# Patient Record
Sex: Female | Born: 1995 | Marital: Single | Smoking: Never smoker
Health system: Southern US, Community
[De-identification: ages and names within clinical notes are randomized; demographics above are authoritative.]

## PROBLEM LIST (undated history)

## (undated) DIAGNOSIS — E282 Polycystic ovarian syndrome: Secondary | ICD-10-CM

## (undated) HISTORY — PX: NO PAST SURGERIES: SHX2092

---

## 2015-11-08 ENCOUNTER — Ambulatory Visit: Payer: Self-pay

## 2016-10-01 ENCOUNTER — Ambulatory Visit: Payer: Self-pay | Admitting: Family Medicine

## 2016-10-07 NOTE — Progress Notes (Addendum)
[facility] [address] [place], [place] [address] [phone]  Date:  10/09/2016   Name:  Lori Adams   DOB:  February 24, 1995   MRN:  [id number]  PCP:  Pearline Cables, MD    Chief Complaint: Establish Care   History of Present Illness:  Lori Adams is a 21 y.o. 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   She is working 30 hours a week and also taking 18 hours of classes - she is very very busy In her free time she likes to catch up on her sleep  She has not really had any major health problems in the past She has noted a rash under her breasts for about a year- seemed to start after she spent a day at the beach and was in wet clothes all day She tried ketoconazole- this helps to reduce the rash but then it will come back. Had never cleared up so far  Her menses tend to be quite irregular; she may go 4-5 months without menses. She has noticed this for about a year, since she gained more weight She gained weight while in college- about 40 lbs over the last 4-5 years.  She put on the last 10- 20 in the last year and this seems to be when her menses started to become so irregular  She had menarche in 6th grade, and her menses were fairly regular until the last year or so She does tend to have heavier hair growth on her body, but does not think that this is new, it is just how she is made  She has also noted more headaches when the weather is hot, and when she tried to do a keto diet.  She has made some efforts at weight loss but admits she is often so busy that she does not stick with  he plan  She has NEVER been SA so she is not concerned about pregnancy   There are no active problems to display for this patient.   History reviewed. No pertinent past medical history.  Past Surgical History:  Procedure Laterality Date  . NO PAST SURGERIES      Social History  Substance Use Topics  . Smoking status: Never Smoker  . Smokeless tobacco: Never Used  . Alcohol use Not on file    History reviewed. No pertinent family history.  No Known Allergies  Medication list has been reviewed and updated.  No current outpatient prescriptions on file prior to visit.   No current facility-administered medications on file prior to visit.     Review of Systems:  As per HPI- otherwise negative. No fever or chills   No fever or chills No CP or SOB No rash except under breasts No nausea, vomiting or diarrhea  Physical Examination: Vitals:   10/09/16 1722  BP: 108/74  Pulse: 87  Resp: 14  Temp: 97.7 F (36.5 C)  SpO2: 97%   Vitals:   10/09/16 1722  Weight: 178 lb 2 oz (80.8  kg)  Height: 5\' 1"  (1.549 m)   Body mass index is 33.66 kg/m. Ideal Body Weight: Weight in (lb) to have BMI = 25: 132  GEN: WDWN, NAD, Non-toxic, A & O x 3,obese, otherwise looks well HEENT: Atraumatic, Normocephalic. Neck supple. No masses, No LAD.  Bilateral TM wnl, oropharynx normal.  PEERL,EOMI.   Ears and Nose: No external deformity. CV: RRR, No M/G/R. No JVD. No thrill. No extra heart sounds. PULM: CTA B, no wheezes, crackles, rhonchi. No retractions. No resp. distress. No accessory muscle use. ABD: S, NT, ND, +BS. No rebound. No HSM. EXTR: No c/c/e NEURO Normal gait.  PSYCH: Normally interactive. Conversant. Not depressed or anxious appearing.  Calm demeanor.  She has candida intertrigo under her breasts bilaterally   Assessment and Plan: Irregular menses - Plan: metFORMIN (GLUCOPHAGE) 500 MG tablet  Candidal intertrigo - Plan: fluconazole (DIFLUCAN) 150 MG tablet  PCOS  (polycystic ovarian syndrome) - Plan: metFORMIN (GLUCOPHAGE) 500 MG tablet, Hemoglobin A1c, Testosterone  Screening for hyperlipidemia - Plan: Lipid panel  Weight gain - Plan: TSH, Hemoglobin A1c  Screening for diabetes mellitus - Plan: Comprehensive metabolic panel, Hemoglobin A1c  Screening for deficiency anemia - Plan: CBC  Here today as a new patient She will come in for fasting labs soon Treat persistent candida intertrigo with diflucan weekly for 4 weeks She would like to try metformin in hopes of returning her menstrual regularity with presumed PCOS.  Encouraged her to work on gradual weight loss by reducing caloric intake and increasing exercise and seh will try Will plan further follow- up pending labs.    Signed Abbe Amsterdam, MD  Received her labs 9/4  Her testosterone is on the high side of normal for women- PCOS is likely  Called her to discuss.  Went over her labs, will mail her a copy as well.  She is having a little dizziness and lack of energy with metformin, but would like to try and take it for 3-4 months and see how she does  Lab Results  Component Value Date   HGBA1C 5.5 10/16/2016   Lab Results  Component Value Date   TESTOSTERONE 62.14 (H) 10/16/2016

## 2016-10-09 ENCOUNTER — Ambulatory Visit (INDEPENDENT_AMBULATORY_CARE_PROVIDER_SITE_OTHER): Payer: BC Managed Care – PPO | Admitting: Family Medicine

## 2016-10-09 ENCOUNTER — Encounter: Payer: Self-pay | Admitting: Family Medicine

## 2016-10-09 VITALS — BP 108/74 | HR 87 | Temp 97.7°F | Resp 14 | Ht 61.0 in | Wt 178.1 lb

## 2016-10-09 DIAGNOSIS — Z13220 Encounter for screening for lipoid disorders: Secondary | ICD-10-CM | POA: Diagnosis not present

## 2016-10-09 DIAGNOSIS — B372 Candidiasis of skin and nail: Secondary | ICD-10-CM

## 2016-10-09 DIAGNOSIS — Z130 Encounter for screening for diseases of the blood and blood-forming organs and certain disorders involving the immune mechanism: Secondary | ICD-10-CM

## 2016-10-09 DIAGNOSIS — N926 Irregular menstruation, unspecified: Secondary | ICD-10-CM

## 2016-10-09 DIAGNOSIS — E282 Polycystic ovarian syndrome: Secondary | ICD-10-CM

## 2016-10-09 DIAGNOSIS — Z131 Encounter for screening for diabetes mellitus: Secondary | ICD-10-CM | POA: Diagnosis not present

## 2016-10-09 DIAGNOSIS — R635 Abnormal weight gain: Secondary | ICD-10-CM

## 2016-10-09 MED ORDER — METFORMIN HCL 500 MG PO TABS: 500.0000 mg | ORAL_TABLET | Freq: Two times a day (BID) | ORAL | 3 refills | Status: DC

## 2016-10-09 MED ORDER — FLUCONAZOLE 150 MG PO TABS: 150.0000 mg | ORAL_TABLET | Freq: Once | ORAL | 0 refills | Status: AC

## 2016-10-09 NOTE — Patient Instructions (Signed)
It was nice to meet you today!  Please come in for labs at your convenience in the next week or two- fasting in the morning. I will be in touch with your results asap   We will have you use the diflucan once a week for 4 weeks to clear up your candida rash Start on the metformin once a day, and increase to twice a day if tolerated for likely PCOS.   We will check your testosterone level on your upcoming labs to help confirm this diagnosis

## 2016-10-16 ENCOUNTER — Other Ambulatory Visit (INDEPENDENT_AMBULATORY_CARE_PROVIDER_SITE_OTHER): Payer: BC Managed Care – PPO

## 2016-10-16 DIAGNOSIS — Z13220 Encounter for screening for lipoid disorders: Secondary | ICD-10-CM | POA: Diagnosis not present

## 2016-10-16 DIAGNOSIS — Z130 Encounter for screening for diseases of the blood and blood-forming organs and certain disorders involving the immune mechanism: Secondary | ICD-10-CM | POA: Diagnosis not present

## 2016-10-16 DIAGNOSIS — R635 Abnormal weight gain: Secondary | ICD-10-CM | POA: Diagnosis not present

## 2016-10-16 DIAGNOSIS — E282 Polycystic ovarian syndrome: Secondary | ICD-10-CM | POA: Diagnosis not present

## 2016-10-16 DIAGNOSIS — Z131 Encounter for screening for diabetes mellitus: Secondary | ICD-10-CM

## 2016-10-16 LAB — COMPREHENSIVE METABOLIC PANEL
ALBUMIN: 4.7 g/dL (ref 3.5–5.2)
ALT: 40 U/L — ABNORMAL HIGH (ref 0–35)
AST: 26 U/L (ref 0–37)
Alkaline Phosphatase: 43 U/L (ref 39–117)
BUN: 9 mg/dL (ref 6–23)
CHLORIDE: 103 meq/L (ref 96–112)
CO2: 27 mEq/L (ref 19–32)
Calcium: 9.8 mg/dL (ref 8.4–10.5)
Creatinine, Ser: 0.62 mg/dL (ref 0.40–1.20)
GFR: 127.57 mL/min (ref >60.00)
Glucose, Bld: 100 mg/dL — ABNORMAL HIGH (ref 70–99)
POTASSIUM: 4.2 meq/L (ref 3.5–5.1)
SODIUM: 137 meq/L (ref 135–145)
Total Bilirubin: 0.6 mg/dL (ref 0.2–1.2)
Total Protein: 7.7 g/dL (ref 6.0–8.3)

## 2016-10-16 LAB — LIPID PANEL
CHOLESTEROL: 176 mg/dL (ref 0–200)
HDL: 40.3 mg/dL (ref >39.00)
LDL CALC: 110 mg/dL — AB (ref 0–99)
NonHDL: 135.61
TRIGLYCERIDES: 130 mg/dL (ref 0.0–149.0)
Total CHOL/HDL Ratio: 4
VLDL: 26 mg/dL (ref 0.0–40.0)

## 2016-10-16 LAB — CBC
HCT: 41.7 % (ref 36.0–46.0)
Hemoglobin: 14.1 g/dL (ref 12.0–15.0)
MCHC: 33.7 g/dL (ref 30.0–36.0)
MCV: 86.4 fl (ref 78.0–100.0)
Platelets: 277 10*3/uL (ref 150.0–400.0)
RBC: 4.83 Mil/uL (ref 3.87–5.11)
RDW: 12.4 % (ref 11.5–15.5)
WBC: 7.8 10*3/uL (ref 4.0–10.5)

## 2016-10-16 LAB — TESTOSTERONE: TESTOSTERONE: 62.14 ng/dL — AB (ref 15.00–40.00)

## 2016-10-16 LAB — HEMOGLOBIN A1C: HEMOGLOBIN A1C: 5.5 % (ref 4.6–6.5)

## 2016-10-16 LAB — TSH: TSH: 3.75 u[IU]/mL (ref 0.35–4.50)

## 2016-10-21 ENCOUNTER — Telehealth: Payer: Self-pay | Admitting: Family Medicine

## 2016-10-21 NOTE — Telephone Encounter (Signed)
°  Relation to ZO:XWRUpt:self Call back [phone]   Reason for call:  Patient inquiring about lab results, please advise

## 2016-11-13 NOTE — Telephone Encounter (Signed)
Patient received lab results and confirmed address. Patient stated she was prescribed metFORMIN (GLUCOPHAGE) 500 MG tablet and states medication made her [place]ase[place] and states she would like an alternate. Patient states due to exams and work she declined appointment at this time. Patient states she would like PCP to follow up with her at 11am tommorrow, informed patient PCP is out of the office until Monday. Advised patient to sign up with MyChart so she can communicate with PCP at her convenience, patient voice understanding

## 2016-11-13 NOTE — Telephone Encounter (Signed)
Called her and [facility]- I sent her a letter with her labs on 9/4- perhaps we do not have the correct address?  Will send out another copy, went over results briefly in message as well

## 2016-11-14 ENCOUNTER — Encounter: Payer: Self-pay | Admitting: Family Medicine

## 2016-11-14 NOTE — Telephone Encounter (Signed)
My chart is active, will communicate with pt there

## 2016-11-17 NOTE — Telephone Encounter (Signed)
Noted  

## 2016-11-20 ENCOUNTER — Ambulatory Visit (INDEPENDENT_AMBULATORY_CARE_PROVIDER_SITE_OTHER): Payer: BC Managed Care – PPO | Admitting: Family Medicine

## 2016-11-20 VITALS — BP 118/76 | HR 89 | Temp 98.3°F | Ht 61.0 in | Wt 171.2 lb

## 2016-11-20 DIAGNOSIS — B372 Candidiasis of skin and nail: Secondary | ICD-10-CM | POA: Diagnosis not present

## 2016-11-20 DIAGNOSIS — Z23 Encounter for immunization: Secondary | ICD-10-CM | POA: Diagnosis not present

## 2016-11-20 DIAGNOSIS — N926 Irregular menstruation, unspecified: Secondary | ICD-10-CM

## 2016-11-20 MED ORDER — NYSTATIN-TRIAMCINOLONE 100000-0.1 UNIT/GM-% EX OINT: 1.0000 "application " | TOPICAL_OINTMENT | Freq: Two times a day (BID) | CUTANEOUS | 0 refills | Status: DC

## 2016-11-20 NOTE — Patient Instructions (Addendum)
It was great to see you again today- try the combination cream that I gave you today,but remember it may take some time for the darkening of your skin to resolve.  That is great that you got a period!  Continue to work on gradual weight loss, but do let me know if your appetite does not come back to normal soon or if you have any belly pain or other concerning symptoms.   Please let me know how your rash responds to the medicatoin

## 2016-11-20 NOTE — Progress Notes (Signed)
[facility] [address] [place], [place] [address] [phone]  Date:  11/20/2016   Name:  Lori Adams   DOB:  [birth year]   MRN:  [id number]  PCP:  Pearline Cables, MD    Chief Complaint: Follow-up (Pt here to f/u on rash under breast. Pt states that the infection is still present. Declined flu vaccine today. )   History of Present Illness:  Lori Adams is a 21 y.o. 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   She is working 30 hours a week and also taking 18 hours of classes - she is very very busy In her free time she likes to catch up on her sleep  She has not really had any major health problems in the past She has noted a rash under her breasts for about a year- seemed to start after she spent a day at the beach and was in wet clothes all day She tried ketoconazole- this helps to reduce the rash but then it will come back. Had never cleared up so far  Her menses tend to be quite irregular; she may go 4-5 months without menses. She has noticed this for about a year, since she gained more weight She gained weight while in college- about 40 lbs over the last 4-5 years.  She put on the last 10- 20 in the last year and this seems to be when her menses started to become so irregular  She had menarche in 6th grade, and her menses were fairly regular until the last year or so She does tend to have heavier hair growth on her body, but does not think that this is new, it is just how she is made  She has also noted more headaches when the weather is hot, and when she tried to do a keto diet.  She has made some efforts at weight loss but admits she is often so busy that she does not stick with he  plan  She has NEVER been SA so she is not concerned about pregnancy   Her clinical picture is c/w PCOS, and she wished to try metformin to try and induce regular periods.  However she felt like it was causing depression and suicidal thoughts, so she stopped using it 3 weeks ago She also states that for the last week she has felt nauseated every time she eats so she is is not eating a whole lot. She denies any belly pain.  There is no way she could be pregnant.    She actaully did have a menstrual bleed which is great news!  Bled for about 3 days a week ago Wt Readings from Last 3 Encounters:  11/20/16 171 lb 3.2 oz (77.7 kg)  10/09/16 178 lb 2 oz (80.8 kg)   Flu shot is due   We tried some diflucan for her rash but it did not help  Patient Active Problem List   Diagnosis Date Noted  . Irregular menses 10/09/2016  . PCOS (polycystic ovarian syndrome) 10/09/2016  . Weight gain 10/09/2016    No past medical history on file.  Past Surgical History:  Procedure Laterality  Date  . NO PAST SURGERIES      Social History  Substance Use Topics  . Smoking status: Never Smoker  . Smokeless tobacco: Never Used  . Alcohol use Not on file    No family history on file.  No Known Allergies  Medication list has been reviewed and updated.  Current Outpatient Prescriptions on File Prior to Visit  Medication Sig Dispense Refill  . metFORMIN (GLUCOPHAGE) 500 MG tablet Take 1 tablet (500 mg total) by mouth 2 (two) times daily with a meal. (Patient not taking: Reported on 11/20/2016) 180 tablet 3   No current facility-administered medications on file prior to visit.     Review of Systems:  As per HPI- otherwise negative.   Physical Examination: Vitals:   11/20/16 1117  BP: 118/76  Pulse: 89  Temp: 98.3 F (36.8 C)  SpO2: 98%   Vitals:   11/20/16 1117  Weight: 171 lb 3.2 oz (77.7 kg)  Height:  (1.549 m)   Body mass index is 32.35 kg/m. Ideal Body Weight: Weight in  (lb) to have BMI = 25: 132  GEN: WDWN, NAD, Non-toxic, A & O x 3, obese, but has lost a few lbs  HEENT: Atraumatic, Normocephalic. Neck supple. No masses, No LAD. Ears and Nose: No external deformity. CV: RRR, No M/G/R. No JVD. No thrill. No extra heart sounds. PULM: CTA B, no wheezes, crackles, rhonchi. No retractions. No resp. distress. No accessory muscle use. ABD: S, NT, ND, +BS. No rebound. No HSM.  Benign belly EXTR: No c/c/e NEURO Normal gait.  PSYCH: Normally interactive. Conversant. Not depressed or anxious appearing.  Calm demeanor.  Hyperpigmented confluent macular rash under both breasts likely due to candida intertrigo   Assessment and Plan: Irregular menses  Candidal intertrigo - Plan: nystatin-triamcinolone ointment (MYCOLOG) PCOS- she stopped metformin as she was not able to tolerate it.  However she has lost 10 lbs and also did have a spontaneous bleed about 10 days ago.  Encouraged her to continue to work on gradual weight loss Po dilfucan did not resolve her rash- will try mycolog for her.  However did ask her to let me know how this works for her over the next 2-3 weeks.  Also counseled that hyperpigmentation may take several months to resolve    Signed Abbe Amsterdam, MD

## 2017-01-06 ENCOUNTER — Emergency Department (HOSPITAL_COMMUNITY)
Admission: EM | Admit: 2017-01-06 | Discharge: 2017-01-06 | Discharge status: Against Medical Advice | Payer: BC Managed Care – PPO | Attending: Emergency Medicine | Admitting: Emergency Medicine

## 2017-01-06 ENCOUNTER — Emergency Department (HOSPITAL_COMMUNITY): Payer: BC Managed Care – PPO

## 2017-01-06 ENCOUNTER — Encounter (HOSPITAL_COMMUNITY): Payer: Self-pay | Admitting: Emergency Medicine

## 2017-01-06 DIAGNOSIS — Z5321 Procedure and treatment not carried out due to patient leaving prior to being seen by health care provider: Secondary | ICD-10-CM | POA: Diagnosis not present

## 2017-01-06 DIAGNOSIS — R0789 Other chest pain: Secondary | ICD-10-CM | POA: Diagnosis present

## 2017-01-06 LAB — CBC
HEMATOCRIT: 40.2 % (ref 36.0–46.0)
HEMOGLOBIN: 13.8 g/dL (ref 12.0–15.0)
MCH: 29.3 pg (ref 26.0–34.0)
MCHC: 34.3 g/dL (ref 30.0–36.0)
MCV: 85.4 fL (ref 78.0–100.0)
Platelets: 303 10*3/uL (ref 150–400)
RBC: 4.71 MIL/uL (ref 3.87–5.11)
RDW: 12.1 % (ref 11.5–15.5)
WBC: 8 10*3/uL (ref 4.0–10.5)

## 2017-01-06 LAB — BASIC METABOLIC PANEL
ANION GAP: 9 (ref 5–15)
BUN: 8 mg/dL (ref 6–20)
CO2: 27 mmol/L (ref 22–32)
Calcium: 9.4 mg/dL (ref 8.9–10.3)
Chloride: 103 mmol/L (ref 101–111)
Creatinine, Ser: 0.52 mg/dL (ref 0.44–1.00)
Glucose, Bld: 105 mg/dL — ABNORMAL HIGH (ref 65–99)
POTASSIUM: 3.5 mmol/L (ref 3.5–5.1)
SODIUM: 139 mmol/L (ref 135–145)

## 2017-01-06 LAB — I-STAT TROPONIN, ED: Troponin i, poc: 0 ng/mL (ref 0.00–0.08)

## 2017-01-06 NOTE — ED Notes (Signed)
no answer from lobby

## 2017-01-06 NOTE — ED Triage Notes (Signed)
Patient c/o intermittent heaviness in central chest with SOB and nausea since waking up this morning. Denies V/D. Ambulatory.

## 2017-01-06 NOTE — ED Notes (Signed)
No answer from lobby  

## 2017-03-26 ENCOUNTER — Encounter: Payer: Self-pay | Admitting: Family Medicine

## 2017-03-26 ENCOUNTER — Ambulatory Visit: Payer: BC Managed Care – PPO | Admitting: Family Medicine

## 2017-03-26 VITALS — BP 122/82 | HR 78 | Temp 97.7°F | Ht 61.0 in | Wt 165.2 lb

## 2017-03-26 DIAGNOSIS — R233 Spontaneous ecchymoses: Secondary | ICD-10-CM

## 2017-03-26 DIAGNOSIS — E282 Polycystic ovarian syndrome: Secondary | ICD-10-CM | POA: Diagnosis not present

## 2017-03-26 DIAGNOSIS — R740 Nonspecific elevation of levels of transaminase and lactic acid dehydrogenase [LDH]: Secondary | ICD-10-CM | POA: Diagnosis not present

## 2017-03-26 DIAGNOSIS — R238 Other skin changes: Secondary | ICD-10-CM

## 2017-03-26 DIAGNOSIS — Z113 Encounter for screening for infections with a predominantly sexual mode of transmission: Secondary | ICD-10-CM

## 2017-03-26 DIAGNOSIS — R7401 Elevation of levels of liver transaminase levels: Secondary | ICD-10-CM

## 2017-03-26 DIAGNOSIS — Z789 Other specified health status: Secondary | ICD-10-CM

## 2017-03-26 DIAGNOSIS — N926 Irregular menstruation, unspecified: Secondary | ICD-10-CM

## 2017-03-26 LAB — COMPREHENSIVE METABOLIC PANEL
ALBUMIN: 4.8 g/dL (ref 3.5–5.2)
ALT: 49 U/L — ABNORMAL HIGH (ref 0–35)
AST: 28 U/L (ref 0–37)
Alkaline Phosphatase: 46 U/L (ref 39–117)
BUN: 9 mg/dL (ref 6–23)
CALCIUM: 9.9 mg/dL (ref 8.4–10.5)
CHLORIDE: 102 meq/L (ref 96–112)
CO2: 26 mEq/L (ref 19–32)
Creatinine, Ser: 0.54 mg/dL (ref 0.40–1.20)
GFR: 149.03 mL/min (ref >60.00)
Glucose, Bld: 93 mg/dL (ref 70–99)
POTASSIUM: 3.7 meq/L (ref 3.5–5.1)
SODIUM: 138 meq/L (ref 135–145)
Total Bilirubin: 0.5 mg/dL (ref 0.2–1.2)
Total Protein: 8.1 g/dL (ref 6.0–8.3)

## 2017-03-26 LAB — CBC
HCT: 44.5 % (ref 36.0–46.0)
Hemoglobin: 15.4 g/dL — ABNORMAL HIGH (ref 12.0–15.0)
MCHC: 34.5 g/dL (ref 30.0–36.0)
MCV: 85.3 fl (ref 78.0–100.0)
PLATELETS: 346 10*3/uL (ref 150.0–400.0)
RBC: 5.22 Mil/uL — ABNORMAL HIGH (ref 3.87–5.11)
RDW: 12.4 % (ref 11.5–15.5)
WBC: 7.6 10*3/uL (ref 4.0–10.5)

## 2017-03-26 LAB — VITAMIN B12: Vitamin B-12: 373 pg/mL (ref 211–911)

## 2017-03-26 LAB — POCT URINE PREGNANCY: PREG TEST UR: NEGATIVE

## 2017-03-26 MED ORDER — LEVONORGESTREL-ETHINYL ESTRAD 0.1-20 MG-MCG PO TABS
1.0000 | ORAL_TABLET | Freq: Every day | ORAL | 4 refills | Status: DC
Start: 2017-03-26 — End: 2017-10-01

## 2017-03-26 NOTE — Patient Instructions (Addendum)
It was nice to see you today! I will be in touch with your labs asap   You can start on birth control pill after you next period.  It can be easiest to start the pill on the Sunday after your bleeding begins. However, if you do not get a period in the next 1-2 weeks you can start on the pill anyway. Take it once a day, about the same time each day.   Let me know if any problems or concerns

## 2017-03-26 NOTE — Progress Notes (Addendum)
[facility] [address], [address] [place], [place] [address] [phone]  Date:  03/26/2017   Name:  Lori Adams   DOB:  1995/03/15   MRN:  [id number]  PCP:  Pearline Cables, MD    Chief Complaint: Weight Loss (c/o weight loss, and fatigue. )   History of Present Illness:  Lori Adams is a 22 y.o. very pleasant female patient who presents with the following:  I last saw this pt in October of 18 PCOS- she stopped metformin as she was not able to tolerate it.  However she has lost 10 lbs and also did have a spontaneous bleed about 10 days ago.  Encouraged her to continue to work on gradual weight loss Po dilfucan did not resolve her rash- will try mycolog for her.  However did ask her to let me know how this works for her over the next 2-3 weeks.  Also counseled that hyperpigmentation may take several months to resolve   She has started getting menses again- this is good news  Wt Readings from Last 3 Encounters:  03/26/17 165 lb 3.2 oz (74.9 kg)  11/20/16 171 lb 3.2 oz (77.7 kg)  10/09/16 178 lb 2 oz (80.8 kg)   She has lost some weight and this seems to have helped trigger regular menses  She has been engaging in some sexual activity although not intercourse  She has noted "muscle aches and fatigue all the time" and that she will get tiny bruises on her body easily  She did become a vegetarian over the last few months- will check a B12 for her   She would like to start on OCP at this time for menstrual regulation- No history of DVT or PT No migraine Non -smoker No history of cancer or HTN  BP Readings from Last 3 Encounters:  03/26/17 122/82  01/06/17 (!) 156/84  11/20/16 118/76     Patient Active Problem List   Diagnosis Date Noted  . Irregular menses 10/09/2016  . PCOS (polycystic ovarian syndrome) 10/09/2016  . Weight gain 10/09/2016    No past medical history on file.  Past Surgical History:   Procedure Laterality Date  . NO PAST SURGERIES      Social History   Tobacco Use  . Smoking status: Never Smoker  . Smokeless tobacco: Never Used  Substance Use Topics  . Alcohol use: Not on file  . Drug use: Not on file    No family history on file.  No Known Allergies  Medication list has been reviewed and updated.  Current Outpatient Medications on File Prior to Visit  Medication Sig Dispense Refill  . nystatin-triamcinolone ointment (MYCOLOG) Apply 1 application topically 2 (two) times daily. 60 g 0   No current facility-administered medications on file prior to visit.     Review of Systems:  As per HPI- otherwise negative. No fever or chills No CP or SOB No nausea or vomiting   Physical Examination: Vitals:   03/26/17 1357  BP: 122/82  Pulse: 78  Temp: 97.7 F (36.5 C)  SpO2: 98%   Vitals:   03/26/17 1357  Weight: 165 lb 3.2 oz (74.9 kg)  Height: 5\' 1"  (1.549 m)   Body mass index is 31.21 kg/m. Ideal Body Weight: Weight in (lb) to have BMI = 25: 132  GEN: WDWN, NAD, Non-toxic, A & O x 3 HEENT: Atraumatic, Normocephalic. Neck supple. No masses, No  LAD.  Bilateral TM wnl, oropharynx normal.  PEERL,EOMI.   Ears and Nose: No external deformity. CV: RRR, No M/G/R. No JVD. No thrill. No extra heart sounds. PULM: CTA B, no wheezes, crackles, rhonchi. No retractions. No resp. distress. No accessory muscle use. ABD: S, NT, ND, +BS. No rebound. No HSM. EXTR: No c/c/e NEURO Normal gait.  PSYCH: Normally interactive. Conversant. Not depressed or anxious appearing.  Calm demeanor.  Overweight but has lost.  Looks well today . Results for orders placed or performed in visit on 03/26/17  POCT urine pregnancy  Result Value Ref Range   Preg Test, Ur Negative Negative    Assessment and Plan: PCOS (polycystic ovarian syndrome) - Plan: levonorgestrel-ethinyl estradiol (AVIANE,ALESSE,LESSINA) 0.1-20 MG-MCG tablet, POCT urine pregnancy  Routine screening for  STI (sexually transmitted infection) - Plan: Hepatitis B surface antibody, Hepatitis B surface antigen, Hepatitis C antibody, HIV antibody, RPR, C. trachomatis/N. gonorrhoeae RNA  Easy bruising - Plan: CBC, B12  Transaminitis - Plan: Comprehensive metabolic panel  Menstrual irregularity - Plan: levonorgestrel-ethinyl estradiol (AVIANE,ALESSE,LESSINA) 0.1-20 MG-MCG tablet, POCT urine pregnancy  Vegetarian - Plan: B12  Went over how to start OCP with her. She will let me know if any questions or concerns.  She is not SA, but would like to use this for menstrual regulation.  She is also seeing someone more seriously; would like STI screening although they have not actually had intercourse yet   Will plan further follow- up pending labs.   Signed Abbe AmsterdamJessica Copland, MD  Received her labs 2/8- message to pt   Negative for gonorrhea, chlamydia, hep B, hep C, HIV and syphilis.  You are immune to hep B (from immunization) so you do not need to be tested for this again  Your vitamin B12 level is normal Blood count is normal except your red cells are slightly high- like the opposite of anemia.  This should not be harmful, but we will want to check again in a few months.  Donating blood is a great way to get rid of your "extra" red cells!  Metabolic profile is normal except for very mild elevation of your ALT again.  This is one of your liver tests- however you have several other labs that look at your liver as well and they are all normal. I tend to think this is nothing of concern, but may be due to a bit of fat in your liver.    Please come by in about 2 months for a repeat CBC and liver function panel.  If we continue to see any problems we can look further  Results for orders placed or performed in visit on 03/26/17  C. trachomatis/N. gonorrhoeae RNA  Result Value Ref Range   C. trachomatis RNA, TMA NOT DETECTED NOT DETECT   N. gonorrhoeae RNA, TMA NOT DETECTED NOT DETECT  Hepatitis B  surface antibody  Result Value Ref Range   Hepatitis B-Post 76 > OR = 10 mIU/mL  Hepatitis B surface antigen  Result Value Ref Range   Hepatitis B Surface Ag NON-REACTIVE NON-REACTI  Hepatitis C antibody  Result Value Ref Range   Hepatitis C Ab NON-REACTIVE NON-REACTI   SIGNAL TO CUT-OFF 0.01 <1.00  HIV antibody  Result Value Ref Range   HIV 1&2 Ab, 4th Generation NON-REACTIVE NON-REACTI  RPR  Result Value Ref Range   RPR Ser Ql NON-REACTIVE NON-REACTI  CBC  Result Value Ref Range   WBC 7.6 4.0 - 10.5 K/uL   RBC 5.22 (  H) 3.87 - 5.11 Mil/uL   Platelets 346.0 150.0 - 400.0 K/uL   Hemoglobin 15.4 (H) 12.0 - 15.0 g/dL   HCT 69.6 29.5 - 28.4 %   MCV 85.3 78.0 - 100.0 fl   MCHC 34.5 30.0 - 36.0 g/dL   RDW 13.2 44.0 - 10.2 %  B12  Result Value Ref Range   Vitamin B-12 373 211 - 911 pg/mL  Comprehensive metabolic panel  Result Value Ref Range   Sodium 138 135 - 145 mEq/L   Potassium 3.7 3.5 - 5.1 mEq/L   Chloride 102 96 - 112 mEq/L   CO2 26 19 - 32 mEq/L   Glucose, Bld 93 70 - 99 mg/dL   BUN 9 6 - 23 mg/dL   Creatinine, Ser 7.25 0.40 - 1.20 mg/dL   Total Bilirubin 0.5 0.2 - 1.2 mg/dL   Alkaline Phosphatase 46 39 - 117 U/L   AST 28 0 - 37 U/L   ALT 49 (H) 0 - 35 U/L   Total Protein 8.1 6.0 - 8.3 g/dL   Albumin 4.8 3.5 - 5.2 g/dL   Calcium 9.9 8.4 - 36.6 mg/dL   GFR 440.34 >74.25 mL/min  POCT urine pregnancy  Result Value Ref Range   Preg Test, Ur Negative Negative   Message to pt

## 2017-03-27 ENCOUNTER — Encounter: Payer: Self-pay | Admitting: Family Medicine

## 2017-03-27 DIAGNOSIS — Z789 Other specified health status: Secondary | ICD-10-CM | POA: Insufficient documentation

## 2017-03-27 LAB — C. TRACHOMATIS/N. GONORRHOEAE RNA
C. trachomatis RNA, TMA: NOT DETECTED
N. GONORRHOEAE RNA, TMA: NOT DETECTED

## 2017-03-27 LAB — HEPATITIS B SURFACE ANTIBODY, QUANTITATIVE: HEPATITIS B-POST: 76 m[IU]/mL (ref >=10)

## 2017-03-27 LAB — HEPATITIS C ANTIBODY
Hepatitis C Ab: NONREACTIVE
SIGNAL TO CUT-OFF: 0.01 (ref <1.00)

## 2017-03-27 LAB — RPR: RPR Ser Ql: NONREACTIVE

## 2017-03-27 LAB — HIV ANTIBODY (ROUTINE TESTING W REFLEX): HIV 1&2 Ab, 4th Generation: NONREACTIVE

## 2017-03-27 LAB — HEPATITIS B SURFACE ANTIGEN: Hepatitis B Surface Ag: NONREACTIVE

## 2017-03-27 NOTE — Addendum Note (Signed)
Addended by: Abbe AmsterdamOPLAND, JESSICA C on: 03/27/2017 05:12 PM   Modules accepted: Orders

## 2017-03-31 ENCOUNTER — Telehealth: Payer: Self-pay | Admitting: Family Medicine

## 2017-03-31 NOTE — Telephone Encounter (Signed)
Called her and let her know that her results are on mychart.  She will look at them

## 2017-03-31 NOTE — Telephone Encounter (Signed)
Please advise 

## 2017-03-31 NOTE — Telephone Encounter (Signed)
Copied from CRM [id number]. Topic: Quick Communication - Lab Results >> Mar 31, 2017  9:00 AM Stephannie LiSimmons, Sagan Wurzel L, NT wrote: Patient called wanting lab results from testing done on Thursday

## 2017-06-09 NOTE — Progress Notes (Signed)
[facility] [address] [place], [place] [address] [phone]  Date:  06/11/2017   Name:  Maisy Newport   DOB:  04-25-1995   MRN:  [id number]  PCP:  Pearline Cables, MD    Chief Complaint: painful intercourse (lost virginity last Thursday, and pt has had bleeding and pain on each of the 6 times she's had sex. )   History of Present Illness:  Kehaulani Fruin Rifaie is a 22 y.o. very pleasant female patient who presents with the following:  Generally healthy young woman here today with concern of UTI and bleeding after intercourse  She has noted sx of pain in her back and went to the ER,  was dx with pyelo last night and was put on abx ,but she has not yet started her abx. She has rx for keflex and also for prn zofran and pyridium.  Explained how to use these medications as she was not sure She just recently became sexually active last week.    She had intercourse with a partner several times.  She had planned to become SA and stared on OCP in preparation for this. She did note some pain with penetration each time, and there was some blood on the condom. They did use a condom with each interaction.   However over the last couple of days she has noted some spotting/ light bleeding vaginally even without intercourse and was not sure what to make of this.  She reports that there was no coercion or assault.  However she and her partner are "no longer talking" for some reason, she did not volunteer more details   She had a negative HCG at the ER yesterday She is not having any pelvic or belly pain Her back is pain is actually better now She uses her OCP continuously and she is now on the last 3 active pills in her pack.    She also has recurrent candida intertrigo under her breasts for which I will give her diflucan weekly for 4 weeks   Lab Results  Component Value Date   PREGTESTUR NEGATIVE 06/10/2017     Patient Active  Problem List   Diagnosis Date Noted  . Hepatitis B immune 03/27/2017  . Irregular menses 10/09/2016  . PCOS (polycystic ovarian syndrome) 10/09/2016  . Weight gain 10/09/2016    Past Medical History:  Diagnosis Date  . PCOS (polycystic ovarian syndrome)     Past Surgical History:  Procedure Laterality Date  . NO PAST SURGERIES      Social History   Tobacco Use  . Smoking status: Never Smoker  . Smokeless tobacco: Never Used  Substance Use Topics  . Alcohol use: Yes    Comment: rare  . Drug use: Yes    Types: Marijuana    No family history on file.  No Known Allergies  Medication list has been reviewed and updated.  Current Outpatient Medications on File Prior to Visit  Medication Sig Dispense Refill  . cephALEXin (KEFLEX) 500 MG capsule Take 1 capsule (500 mg total) by mouth 4 (four) times daily for 10 days. 40 capsule 0  . levonorgestrel-ethinyl estradiol (AVIANE,ALESSE,LESSINA) 0.1-20 MG-MCG tablet Take 1 tablet by mouth daily. 3 Package 4  . nystatin-triamcinolone ointment (MYCOLOG) Apply 1 application topically 2 (two) times daily. 60 g 0  . ondansetron (ZOFRAN) 4 MG tablet Take 1 tablet (4 mg total) by mouth every 8 (eight) hours  as needed for nausea or vomiting. 4 tablet 0  . phenazopyridine (PYRIDIUM) 200 MG tablet Take 1 tablet (200 mg total) by mouth 3 (three) times daily. 6 tablet 0   No current facility-administered medications on file prior to visit.     Review of Systems:  As per HPI- otherwise negative.   Physical Examination: Vitals:   06/11/17 1126  BP: 108/72  Pulse: 76  Temp: 98.7 F (37.1 C)  SpO2: 96%   Vitals:   06/11/17 1126  Weight: 155 lb 12.8 oz (70.7 kg)  Height: 5\' 1"  (1.549 m)   Body mass index is 29.44 kg/m. Ideal Body Weight: Weight in (lb) to have BMI = 25: 132  GEN: WDWN, NAD, Non-toxic, A & O x 3, overweight, looks well    HEENT: Atraumatic, Normocephalic. Neck supple. No masses, No LAD.  Bilateral TM wnl,  oropharynx normal.  PEERL,EOMI.   Ears and Nose: No external deformity. CV: RRR, No M/G/R. No JVD. No thrill. No extra heart sounds. PULM: CTA B, no wheezes, crackles, rhonchi. No retractions. No resp. distress. No accessory muscle use. ABD: S, NT, ND, +BS. No rebound. No HSM.  Benign belly EXTR: No c/c/e NEURO Normal gait.  PSYCH: Normally interactive. Conversant. Not depressed or anxious appearing.  Calm demeanor.  Pelvic- no evidence of trauma to the vaginal canal noted today on gentle speculum exam.  She does have bleeding which appears to be from the uterus - normal menses  Mild candida intertrigo under bilateral breasts   Assessment and Plan: Bleeding after intercourse  Candidal intertrigo - Plan: fluconazole (DIFLUCAN) 150 MG tablet  Vaginal bleeding  Sexual debut last week, she had some light spotting with intercourse and was concerned as she now has further bleeding. However, it appears that she is having normal uterine bleeding likely due to breakthrough after 3 months of continuous OCP use  She is reassured and will let me know if any other issues come up  Signed Abbe AmsterdamJessica Copland, MD

## 2017-06-10 ENCOUNTER — Encounter (HOSPITAL_COMMUNITY): Payer: Self-pay | Admitting: Emergency Medicine

## 2017-06-10 ENCOUNTER — Other Ambulatory Visit: Payer: Self-pay

## 2017-06-10 ENCOUNTER — Emergency Department (HOSPITAL_COMMUNITY)
Admission: EM | Admit: 2017-06-10 | Discharge: 2017-06-10 | Disposition: A | Discharge status: Home / Self Care | Payer: BC Managed Care – PPO | Attending: Emergency Medicine | Admitting: Emergency Medicine

## 2017-06-10 DIAGNOSIS — R300 Dysuria: Secondary | ICD-10-CM | POA: Diagnosis present

## 2017-06-10 DIAGNOSIS — N12 Tubulo-interstitial nephritis, not specified as acute or chronic: Secondary | ICD-10-CM | POA: Diagnosis not present

## 2017-06-10 DIAGNOSIS — Z79899 Other long term (current) drug therapy: Secondary | ICD-10-CM | POA: Insufficient documentation

## 2017-06-10 HISTORY — DX: Polycystic ovarian syndrome: E28.2

## 2017-06-10 LAB — URINALYSIS, ROUTINE W REFLEX MICROSCOPIC
Bilirubin Urine: NEGATIVE
GLUCOSE, UA: NEGATIVE mg/dL
Ketones, ur: NEGATIVE mg/dL
Nitrite: NEGATIVE
Protein, ur: 100 mg/dL — AB
Specific Gravity, Urine: 1.015 (ref 1.005–1.030)
WBC, UA: >50 WBC/hpf — ABNORMAL HIGH (ref 0–5)
pH: 5 (ref 5.0–8.0)

## 2017-06-10 LAB — POC URINE PREG, ED: PREG TEST UR: NEGATIVE

## 2017-06-10 MED ORDER — CEPHALEXIN 500 MG PO CAPS: 500.0000 mg | ORAL_CAPSULE | Freq: Four times a day (QID) | ORAL | 0 refills | Status: AC

## 2017-06-10 MED ORDER — LIDOCAINE HCL 1 % IJ SOLN
INTRAMUSCULAR | Status: AC
Start: 2017-06-10 — End: 2017-06-10
  Administered 2017-06-10: 20 mL
  Filled 2017-06-10: qty 20

## 2017-06-10 MED ORDER — ONDANSETRON 4 MG PO TBDP
4.0000 mg | ORAL_TABLET | Freq: Once | ORAL | Status: AC
  Administered 2017-06-10: 4 mg via ORAL
  Filled 2017-06-10: qty 1

## 2017-06-10 MED ORDER — PHENAZOPYRIDINE HCL 200 MG PO TABS: 200.0000 mg | ORAL_TABLET | Freq: Three times a day (TID) | ORAL | 0 refills | Status: DC

## 2017-06-10 MED ORDER — KETOROLAC TROMETHAMINE 30 MG/ML IJ SOLN
30.0000 mg | Freq: Once | INTRAMUSCULAR | Status: AC
  Administered 2017-06-10: 30 mg via INTRAMUSCULAR
  Filled 2017-06-10: qty 1

## 2017-06-10 MED ORDER — ONDANSETRON HCL 4 MG PO TABS: 4.0000 mg | ORAL_TABLET | Freq: Three times a day (TID) | ORAL | 0 refills | Status: DC | PRN

## 2017-06-10 MED ORDER — CEFTRIAXONE SODIUM 1 G IJ SOLR
1.0000 g | Freq: Once | INTRAMUSCULAR | Status: AC
  Administered 2017-06-10: 1 g via INTRAMUSCULAR
  Filled 2017-06-10: qty 10

## 2017-06-10 NOTE — ED Triage Notes (Signed)
Pt states she has bilateral flank pain that comes around and goes into her groin area along with urinary frequency and dysuria   Pt also states she has just recently started having intercourse and when she does she has bleeding

## 2017-06-10 NOTE — ED Notes (Signed)
PT NOT IN LOB[facility]

## 2017-06-10 NOTE — Discharge Instructions (Addendum)
Your urinalysis is consistent with a UTI. Given your nausea at home as well as flank tenderness I am covering you for a kidney infection. This is also known as pyelonephritis. You were given intramuscular antibiotics in the department. Your urinalysis was sent for culture.   Your discharge medications include: 1) Keflex Please take all of your antibiotics until finished!   You may develop abdominal discomfort or diarrhea from the antibiotic.  You may help offset this with probiotics which you can buy or get in yogurt. Do not eat or take the probiotics until 2 hours after your antibiotic. Do not take your medicine if develop an itchy rash, swelling in your mouth or lips, or difficulty breathing.  2) Pyridium  This medication will help relieve pain and burning but does not treat the infection.  Make sure that you wear a panty liner as it may stain your underwear. Do not be alarmed if this turns your urine orange. To void upset stomach please take with food. 3) Zofran - Take as needed for nausea.  Home care instructions are as follows:  1) Please drink plenty of water. Avoid tea and beverages with caffeine like coffee or soda 2) If you are sexually active, make sure to urinate immediately after intercourse.   Follow up:  Please follow up with your primary care physician in 1 days for your scheduled appointment.  You deferred workup for your vaginal bleeding with intercourse today. Please discuss this during follow up with your PCP.    Contact a health care provider if: Your symptoms do not get better after 2 days of treatment. Your symptoms get worse. You have a fever. Get help right away if: You are unable to take your antibiotics or fluids. You have shaking chills. You vomit. You have severe flank or back pain. You have extreme weakness or fainting. Additional Information:  Your vital signs today were: BP (!) 121/91 (BP Location: Left Arm)    Pulse 84    Temp 98.7 F (37.1 C) (Oral)    Ht  5\' 1"  (1.549 m)    Wt 70.3 kg (155 lb)    SpO2 99%    BMI 29.29 kg/m  If your blood pressure (BP) was elevated above 135/85 this visit, please have this repeated by your doctor within one month. ---------------

## 2017-06-10 NOTE — ED Notes (Signed)
PT NOW LOCATED IN LOB[facility]

## 2017-06-10 NOTE — ED Provider Notes (Signed)
[facility]-EMERGENCY DEPT Provider Note   CSN: [id number] Arrival date & time: 06/10/17  0152     History   Chief Complaint Chief Complaint  Patient presents with  . Dysuria  . Flank Pain    HPI Lori Adams is a 22 y.o. female with a history of PCOS who presents to the emergency department today for urinary symptoms.  She notes that over the last 2 days she has been having urinary frequency, urinary urgency, dysuria as well as mild hematuria.  She notes that at times she will have suprapubic abdominal pain as well as bilateral lower back pain.  The patient notes that she recently became sexually active.  She reports one female sexual partner that always use protection.  She states she is on concern for STDs as her boyfriend recently was tested and he was negative.  She reports that during sexual intercourse she does have a small amount of bleeding afterwards that resolves approximately 1 hours later.  She denies any pain associated with this.  Patient denies any current vaginal pain, pelvic pain, vaginal discharge, vaginal bleeding.  She has not been taking anything for symptoms.  No fever, chills. She notes some nausea without emesis.  No history of kidney stones.  HPI  Past Medical History:  Diagnosis Date  . PCOS (polycystic ovarian syndrome)     Patient Active Problem List   Diagnosis Date Noted  . Hepatitis B immune 03/27/2017  . Irregular menses 10/09/2016  . PCOS (polycystic ovarian syndrome) 10/09/2016  . Weight gain 10/09/2016    Past Surgical History:  Procedure Laterality Date  . NO PAST SURGERIES       OB History   None      Home Medications    Prior to Admission medications   Medication Sig Start Date End Date Taking? Authorizing Provider  levonorgestrel-ethinyl estradiol (AVIANE,ALESSE,LESSINA) 0.1-20 MG-MCG tablet Take 1 tablet by mouth daily. 03/26/17   Copland, Gwenlyn Found, MD  nystatin-triamcinolone ointment (MYCOLOG) Apply 1  application topically 2 (two) times daily. 11/20/16   Copland, Gwenlyn Found, MD    Family History History reviewed. No pertinent family history.  Social History Social History   Tobacco Use  . Smoking status: Never Smoker  . Smokeless tobacco: Never Used  Substance Use Topics  . Alcohol use: Yes    Comment: rare  . Drug use: Yes    Types: Marijuana     Allergies   Patient has no known allergies.   Review of Systems Review of Systems  All other systems reviewed and are negative.    Physical Exam Updated Vital Signs BP (!) 121/91 (BP Location: Left Arm)   Pulse 84   Temp 98.7 F (37.1 C) (Oral)   Ht 5\' 1"  (1.549 m)   Wt 70.3 kg (155 lb)   SpO2 99%   BMI 29.29 kg/m   Physical Exam  Constitutional: She appears well-developed and well-nourished.  HENT:  Head: Normocephalic and atraumatic.  Right Ear: External ear normal.  Left Ear: External ear normal.  Nose: Nose normal.  Mouth/Throat: Uvula is midline, oropharynx is clear and moist and mucous membranes are normal. No tonsillar exudate.  Eyes: Pupils are equal, round, and reactive to light. Right eye exhibits no discharge. Left eye exhibits no discharge. No scleral icterus.  Neck: Trachea normal. Neck supple. No spinous process tenderness present. No neck rigidity. Normal range of motion present.  Cardiovascular: Normal rate, regular rhythm and intact distal pulses.  No murmur  heard. Pulses:      Radial pulses are 2+ on the right side, and 2+ on the left side.       Dorsalis pedis pulses are 2+ on the right side, and 2+ on the left side.       Posterior tibial pulses are 2+ on the right side, and 2+ on the left side.  No lower extremity swelling or edema. Calves symmetric in size bilaterally.  Pulmonary/Chest: Effort normal and breath sounds normal. She exhibits no tenderness.  Abdominal: Soft. Bowel sounds are normal. She exhibits no distension. There is no tenderness. There is CVA tenderness (right). There is no  rigidity, no rebound and no guarding.  Musculoskeletal: She exhibits no edema.  Lymphadenopathy:    She has no cervical adenopathy.  Neurological: She is alert.  Skin: Skin is warm and dry. No rash noted. She is not diaphoretic.  Psychiatric: She has a normal mood and affect.  Nursing note and vitals reviewed.    ED Treatments / Results  Labs (all labs ordered are listed, but only abnormal results are displayed) Labs Reviewed  URINALYSIS, ROUTINE W REFLEX MICROSCOPIC - Abnormal; Notable for the following components:      Result Value   APPearance CLOUDY (*)    Hgb urine dipstick LARGE (*)    Protein, ur 100 (*)    Leukocytes, UA LARGE (*)    RBC / HPF >50 (*)    WBC, UA >50 (*)    Bacteria, UA FEW (*)    Non Squamous Epithelial 0-5 (*)    All other components within normal limits  POC URINE PREG, ED    EKG None  Radiology No results found.  Procedures Procedures (including critical care time)  Medications Ordered in ED Medications  cefTRIAXone (ROCEPHIN) injection 1 g (has no administration in time range)  ketorolac (TORADOL) 30 MG/ML injection 30 mg (has no administration in time range)  ondansetron (ZOFRAN-ODT) disintegrating tablet 4 mg (has no administration in time range)     Initial Impression / Assessment and Plan / ED Course  I have reviewed the triage vital signs and the nursing notes.  Pertinent labs & imaging results that were available during my care of the patient were reviewed by me and considered in my medical decision making (see chart for details).     22 y.o. female who presents for 2-day history of urinary frequency, urinary urgency, dysuria as well as mild hematuria.  He notes mild suprapubic abdominal pain as well as bilateral lower back pain at times.  Patient's urinalysis is positive for UTI.  Patient has large leukocytes,  >50 white blood cells.  Patient does have large hemoglobin in her urine but does not have history of kidney stones and  given history I have a low suspicion for this at this time.  Do not feel she needs a CT scan. Patient does have CVA TTP on the right as well as nausea at home. Will cover for pyelo. Urine culture sent. Will tx with 1g Ceftriaxone in the department.   Patient did report vaginal bleeding with intercourse. Informed patient next step in evaluation would be pelvic exam. She states she has follow up with PCP tomorrow and would like to defer at this time. The evaluation does not show pathology that would require ongoing emergent intervention or inpatient treatment. I advised the patient to follow-up with PCP during scheduled appointment tomorrow. I advised the patient to return to the emergency department with new or worsening symptoms  or new concerns. Specific return precautions discussed. The patient verbalized understanding and agreement with plan. All questions answered. No further questions at this time. The patient is hemodynamically stable, mentating appropriately and appears safe for discharge.  Final Clinical Impressions(s) / ED Diagnoses   Final diagnoses:  Pyelonephritis    ED Discharge Orders        Ordered    cephALEXin (KEFLEX) 500 MG capsule  4 times daily     06/10/17 0823    phenazopyridine (PYRIDIUM) 200 MG tablet  3 times daily     06/10/17 0823    ondansetron (ZOFRAN) 4 MG tablet  Every 8 hours PRN     06/10/17 0823       Jacinto HalimMaczis, Antania Hoefling M, PA-C 06/10/17 [id number]    Gwyneth SproutPlunkett, Whitney, MD 06/10/17 2145

## 2017-06-11 ENCOUNTER — Ambulatory Visit (INDEPENDENT_AMBULATORY_CARE_PROVIDER_SITE_OTHER): Payer: BC Managed Care – PPO | Admitting: Family Medicine

## 2017-06-11 ENCOUNTER — Encounter: Payer: Self-pay | Admitting: Family Medicine

## 2017-06-11 VITALS — BP 108/72 | HR 76 | Temp 98.7°F | Ht 61.0 in | Wt 155.8 lb

## 2017-06-11 DIAGNOSIS — N939 Abnormal uterine and vaginal bleeding, unspecified: Secondary | ICD-10-CM | POA: Diagnosis not present

## 2017-06-11 DIAGNOSIS — B372 Candidiasis of skin and nail: Secondary | ICD-10-CM | POA: Diagnosis not present

## 2017-06-11 DIAGNOSIS — N930 Postcoital and contact bleeding: Secondary | ICD-10-CM

## 2017-06-11 MED ORDER — FLUCONAZOLE 150 MG PO TABS: 150.0000 mg | ORAL_TABLET | Freq: Once | ORAL | 0 refills | Status: AC

## 2017-06-11 NOTE — Patient Instructions (Signed)
It was great to see you today- go ahead and start on the antibiotic- keflex- from the ER.  The other 2 meds are optional to use as needed for symptoms.   It looks like you are getting your menses right now- I think this is the cause of your current bleeding.  However, please let me know if you get concerned or have any other issues or pains!    Continue to take your birth control pill daily- however remember that antibiotics can interfere with the function of your pills so I would recommend that you use condoms if you have intercourse in the next month

## 2017-06-13 LAB — URINE CULTURE

## 2017-06-14 ENCOUNTER — Telehealth: Payer: Self-pay

## 2017-06-14 NOTE — Telephone Encounter (Signed)
Post ED Visit - Positive Culture Follow-up: Successful Patient Follow-Up  Culture assessed and recommendations reviewed by:  Enzo Bi, Pharm.D.  Celedonio Miyamoto, Pharm.D., BCPS AQ-ID  Garvin Fila, Pharm.D., BCPS  Georgina Pillion, Pharm.D., BCPS  [place], [place].D., BCPS, AAHIVP  Estella Husk, Pharm.D., BCPS, AAHIVP  Lysle Pearl, PharmD, BCPS  Casilda Carls, PharmD, BCPS  Pollyann Samples, PharmD, BCPS [facility] Pharm D Positive urine culture   Patient discharged without antimicrobial prescription and treatment is now indicated  Organism is resistant to prescribed ED discharge antimicrobial  Patient with positive blood cultures  Changes discussed with ED provider: Fabiola Backer [facility] New antibiotic prescription Bactrim DS 1 BID x 10 days Called to [facility] [phone]  Contacted patient, date 06/14/17, time 0959   Jerry Caras 06/14/2017, 9:56 AM

## 2017-06-14 NOTE — Progress Notes (Signed)
ED Antimicrobial Stewardship Positive Culture Follow Up   Lori Adams is an 22 y.o. female who presented to [facility] on 06/10/2017 with a chief complaint of vaginal bleeding and back pain.  Chief Complaint  Patient presents with  . Dysuria  . Flank Pain    Recent Results (from the past 720 hour(s))  Urine culture     Status: Abnormal   Collection Time: 06/10/17  4:21 AM  Result Value Ref Range Status   Specimen Description   Final    URINE, CLEAN CATCH Performed at [facility], [address] [place], [place] [address]    Special Requests   Final    NONE Performed at [facility], [address] [place], [place] [address]    Culture >=100,000 COLONIES/mL ENTEROBACTER AEROGENES (A)  Final   Report Status 06/13/2017 FINAL  Final   Organism ID, Bacteria ENTEROBACTER AEROGENES (A)  Final      Susceptibility   Enterobacter aerogenes - MIC*    CEFAZOLIN >=64 RESISTANT Resistant     CEFTRIAXONE <=1 SENSITIVE Sensitive     CIPROFLOXACIN <=0.25 SENSITIVE Sensitive     GENTAMICIN <=1 SENSITIVE Sensitive     IMIPENEM 1 SENSITIVE Sensitive     NITROFURANTOIN 64 INTERMEDIATE Intermediate     TRIMETH/SULFA <=20 SENSITIVE Sensitive     PIP/TAZO <=4 SENSITIVE Sensitive     * >=100,000 COLONIES/mL ENTEROBACTER AEROGENES     Treated with Keflex, organism resistant to prescribed antimicrobial  Patient discharged originally without antimicrobial agent and treatment is now indicated  New antibiotic prescription: Bactrim DS 1 tablet twice daily x 10 days   ED Provider: Dietrich Pates, PA-C    Vinnie Level, PharmD., BCPS Clinical Pharmacist Clinical phone for 06/14/17 until 3:30pm: [phone] If after 3:30pm, please call main pharmacy at: [phone]

## 2017-06-24 ENCOUNTER — Encounter: Payer: Self-pay | Admitting: Family Medicine

## 2017-09-28 ENCOUNTER — Telehealth: Payer: Self-pay | Admitting: *Deleted

## 2017-09-28 NOTE — Telephone Encounter (Signed)
[facility]; forwarded to [place] for email/scan/SLS 08/12

## 2017-10-01 ENCOUNTER — Encounter: Payer: Self-pay | Admitting: Family Medicine

## 2017-10-01 ENCOUNTER — Ambulatory Visit: Payer: BC Managed Care – PPO | Admitting: Family Medicine

## 2017-10-01 VITALS — BP 112/70 | HR 81 | Temp 98.6°F | Resp 16 | Ht 61.0 in | Wt 162.0 lb

## 2017-10-01 DIAGNOSIS — L989 Disorder of the skin and subcutaneous tissue, unspecified: Secondary | ICD-10-CM

## 2017-10-01 NOTE — Progress Notes (Signed)
[facility] [address] [place], [place] [phone] [phone]  Date:  10/01/2017   Name:  Lori Adams   DOB:  05/06/1995   MRN:  [id number]  PCP:  Pearline Cablesopland, Allyn Bertoni C, MD    Chief Complaint: Skin Lesion (left thumb, several months, no change in size or shape)   History of Present Illness:  Lori HartiganJehan Al Adams is a 22 y.o. very pleasant female patient who presents with the following:  Pt here today with a skin concern History of PCOS, on OCP There is a bump on her left thumb that has been there for 1-2 months Not painful, but just looks funny She is not aware of any trauma to the area She is otherwise feeling well   Patient Active Problem List   Diagnosis Date Noted  . Hepatitis B immune 03/27/2017  . Irregular menses 10/09/2016  . PCOS (polycystic ovarian syndrome) 10/09/2016  . Weight gain 10/09/2016    Past Medical History:  Diagnosis Date  . PCOS (polycystic ovarian syndrome)     Past Surgical History:  Procedure Laterality Date  . NO PAST SURGERIES      Social History   Tobacco Use  . Smoking status: Never Smoker  . Smokeless tobacco: Never Used  Substance Use Topics  . Alcohol use: Yes    Comment: rare  . Drug use: Yes    Types: Marijuana    History reviewed. No pertinent family history.  No Known Allergies  Medication list has been reviewed and updated.  No current outpatient medications on file prior to visit.   No current facility-administered medications on file prior to visit.     Review of Systems:  As per HPI- otherwise negative.   Physical Examination: Vitals:   10/01/17 0842  BP: 112/70  Pulse: 81  Resp: 16  Temp: 98.6 F (37 C)  SpO2: 98%   Vitals:   10/01/17 0842  Weight: 162 lb (73.5 kg)  Height: 5\' 1"  (1.549 m)   Body mass index is 30.61 kg/m. Ideal Body Weight: Weight in (lb) to have BMI = 25: 132  GEN: WDWN, NAD, Non-toxic, A & O x 3, overweight,  looks well  HEENT: Atraumatic, Normocephalic. Neck supple. No masses, No LAD. Ears and Nose: No external deformity. CV: RRR, No M/G/R. No JVD. No thrill. No extra heart sounds. PULM: CTA B, no wheezes, crackles, rhonchi. No retractions. No resp. distress. No accessory muscle use. EXTR: No c/c/e NEURO Normal gait.  PSYCH: Normally interactive. Conversant. Not depressed or anxious appearing.  Calm demeanor.  Left thumb displays a darkened, raised papule just adjacent to the nail fold  It is about 2-3 mm in diameter  May represent a wart that is irritated and bled under lesion from pt attempting to remove at home   VC obtained, cryo therapy x3 with LN Assessment and Plan: Skin lesion of hand  ? Wart on left thumb Froze for her today If this does not resolve lesion in a few weeks she will contact me for derm referral   Signed Abbe AmsterdamJessica Arshan Jabs, MD

## 2017-10-01 NOTE — Patient Instructions (Addendum)
Good to see you today!  Pleas let me know if the bump on your thumb does not resolve after freezing it today- in that case will refer you to see dermatology

## 2018-05-28 ENCOUNTER — Ambulatory Visit: Payer: Self-pay | Admitting: *Deleted

## 2018-05-28 NOTE — Telephone Encounter (Signed)
Patient is calling with concerns that her boss may have exposed her to COVID-19. Patient has been experiencing some mild symptoms that seem to be out of her normal- she states the symptoms are fleeting and are not lasting very long- a day at the most. She coughed for a day felt chest heaviness for a day, the only thing that has lingered has been the fatigue. Patient does not have a fever. Advised patient she needs to self isolate for 14 days and monitor for fever- if no symptoms she can return to normal activity. Make sure she is hydrating well. Will send note to PCP and office my reach out to her on Monday to follow up- she is to call back if her symptoms change. Reason for Disposition . [1] COVID-19 EXPOSURE (Close Contact) within last 14 days AND [2] NO cough, fever, or breathing difficulty  Answer Assessment - Initial Assessment Questions 1. CLOSE CONTACT: "Who is the person with the confirmed or suspected COVID-19 infection that you were exposed to?"     Boss- recent travel 2. PLACE of CONTACT: "Where were you when you were exposed to COVID-19?" (e.g., home, school, medical waiting room; which city?)     work 3. TYPE of CONTACT: "How much contact was there?" (e.g., sitting next to, live in same house, work in same office, same building)     Works in same office- assistant 4. DURATION of CONTACT: "How long were you in contact with the COVID-19 patient?" (e.g., a few seconds, passed by person, a few minutes, live with the patient)     5-7 hours/day 5. DATE of CONTACT: "When did you have contact with a COVID-19 patient?" (e.g., how many days ago)     yesterday 6. TRAVEL: "Have you traveled out of the country recently?" If so, "When and where?"     * Also ask about out-of-state travel, since the CDC has identified some high risk cities for community spread in the [place].     * Note: Travel becomes less relevant if there is widespread community transmission where the patient lives.     Boss traveled to  [place]- returned 3/23 7. COMMUNITY SPREAD: "Are there lots of cases or COVID-19 (community spread) where you live?" (See public health department website, if unsure)   * MAJOR community spread: high number of cases; numbers of cases are increasing; many people hospitalized.   * MINOR community spread: low number of cases; not increasing; few or no people hospitalized     Returned from [place]- not sure where in [place] traveled- flew- drove back 8. SYMPTOMS: "Do you have any symptoms?" (e.g., fever, cough, breathing difficulty)     Fatigue, waves of cough, night sweats, loss appetite, respiratory symptom very short , runny nose 9. PREGNANCY OR POSTPARTUM: "Is there any chance you are pregnant?" "When was your last menstrual period?" "Did you deliver in the last 2 weeks?"     No- LMP- due now 10. HIGH RISK: "Do you have any heart or lung problems? Do you have a weak immune system?" (e.g., CHF, COPD, asthma, HIV positive, chemotherapy, renal failure, diabetes mellitus, sickle cell anemia)       no  Protocols used: CORONAVIRUS (COVID-19) EXPOSURE-A-AH

## 2018-06-01 ENCOUNTER — Telehealth: Payer: Self-pay

## 2018-06-01 NOTE — Telephone Encounter (Signed)
Copied from CRM [id number]. Topic: Quick Communication - See Telephone Encounter >> May 31, 2018  5:59 PM Aretta Nip wrote: CRM for notification. See Telephone encounter for: 05/31/18. PT wants an appt with Dr Patsy Lager for some issues with ADHD and a med she was on in 2015. Also wants a good med to remove a wart that keeps growing back. She states that possible COVID Symptoms she thought she was having went completely away, nerves, studing for exams.  Please call her for a possible virtual appt at [phone]

## 2018-06-01 NOTE — Telephone Encounter (Signed)
LMOM instructing Pt to return call to schedule virtual visit.

## 2018-06-01 NOTE — Telephone Encounter (Signed)
LMOM informing Pt to return call to schedule virtual visit.  

## 2018-06-01 NOTE — Telephone Encounter (Signed)
Virtual visit scheduled 06/03/2018.

## 2018-06-02 ENCOUNTER — Telehealth: Payer: Self-pay | Admitting: Family Medicine

## 2018-06-02 NOTE — Telephone Encounter (Signed)
LVM to update pt insurance card for her VOV with provider on 06-03-2018. Also pt is needed to be changed from [employer] to Webex or Facetime since provider is having issues with [employer] at the moment.

## 2018-06-02 NOTE — Progress Notes (Signed)
[facility] [place] [address], [address] [place], [place] [address] [phone] [phone]  Date:  06/03/2018   Name:  Lori Adams   DOB:  05/29/1995   MRN:  [id number]  PCP:  Pearline Cablesopland, Cassidie Veiga C, MD    Chief Complaint: No chief complaint on file.   History of Present Illness:  Lori HartiganJehan Al Adams is a [age] y.o. very pleasant female patient who presents with the following:  Virtual visit today due to COVID 19 pandemic Pt location is home She is able to work from home Provider location is office Pt ID confirmed with name and DOB, she gives consent for virtual visit today  Pt with history of PCOS Visit today to discuss medication in a couple of other concerns  We froze a wart on her left thumb last August- it seemed to nearly resolve but then came back again. It does not hurt, just feels hard   She has noted some night sweats and chills over the last month or so.  May occur a few times a week.  She will wake up sweaty- not soaked, but she will wake up cold She has not noted any fever She did have body aches and felt really tired a week ago- this lasted for a couple of days.   She checked her temp and it was ok No cough  No ST  She also reports that she was taking vyvanse several years ago- she stopped taking it as she was in college, and did not want to keep up with follow-up visits She reports being dx with adhd in 2014 per a psychiatrist in [place]; she cannot recall the name of the doctor She just took the vyvanse for a little while -just a few months per her report   Patient Active Problem List   Diagnosis Date Noted  . Hepatitis B immune 03/27/2017  . Irregular menses 10/09/2016  . PCOS (polycystic ovarian syndrome) 10/09/2016  . Weight gain 10/09/2016    Past Medical History:  Diagnosis Date  . PCOS (polycystic ovarian syndrome)     Past Surgical History:  Procedure Laterality Date  . NO PAST SURGERIES      Social History    Tobacco Use  . Smoking status: Never Smoker  . Smokeless tobacco: Never Used  Substance Use Topics  . Alcohol use: Yes    Comment: rare  . Drug use: Yes    Types: Marijuana    No family history on file.  No Known Allergies  Medication list has been reviewed and updated.  No current outpatient medications on file prior to visit.   No current facility-administered medications on file prior to visit.     Review of Systems:  As per HPI- otherwise negative.   Physical Examination: There were no vitals filed for this visit. There were no vitals filed for this visit. There is no height or weight on file to calculate BMI. Ideal Body Weight:    Patient observed on camera, she looks well.  No cough, tachypnea, wheezing is noted  Assessment and Plan: Skin lesion of hand  Night sweats  Attention deficit disorder, unspecified hyperactivity presence  Virtual visit today.  She would like to come in to have the wart on her hand refrozen.  She also notes night sweats for the last couple of weeks.  Otherwise she is feeling well, no fever noted She also wishes to go back on Vyvanse, which have not prescribed  in the past.  She reports not having taken this medication in several years.  We made plans for in person visit in 1 week, to further address these concerns.  She will try to find her old bottle of Vyvanse and bring it with her, that way I can see who originally diagnosed her and try to get old records  She will alert me if getting worse, or any other concerns in the meantime  Signed Abbe Amsterdam, MD

## 2018-06-03 ENCOUNTER — Ambulatory Visit (INDEPENDENT_AMBULATORY_CARE_PROVIDER_SITE_OTHER): Payer: Self-pay | Admitting: Family Medicine

## 2018-06-03 ENCOUNTER — Encounter: Payer: Self-pay | Admitting: Family Medicine

## 2018-06-03 ENCOUNTER — Other Ambulatory Visit: Payer: Self-pay

## 2018-06-03 DIAGNOSIS — F988 Other specified behavioral and emotional disorders with onset usually occurring in childhood and adolescence: Secondary | ICD-10-CM

## 2018-06-03 DIAGNOSIS — R61 Generalized hyperhidrosis: Secondary | ICD-10-CM

## 2018-06-03 DIAGNOSIS — L989 Disorder of the skin and subcutaneous tissue, unspecified: Secondary | ICD-10-CM

## 2018-06-03 NOTE — Patient Instructions (Addendum)
It was great to talk to you today!   We will look forward to seeing you in the office next Thursday, April 23 at 3:20 PM.  Please to get her a few months early if you can.  Please bring me your old Vyvanse medication bottle, I can use this to get the name of your last prescriber.  We will plan to freeze your wart at your next visit, and will further evaluate your night sweats.  Of course if you are getting worse or feeling sick in the meantime please let me know

## 2018-06-10 ENCOUNTER — Other Ambulatory Visit: Payer: Self-pay

## 2018-06-10 ENCOUNTER — Ambulatory Visit: Payer: BC Managed Care – PPO | Admitting: Family Medicine

## 2018-06-10 ENCOUNTER — Ambulatory Visit: Payer: Self-pay | Admitting: Family Medicine

## 2018-06-10 ENCOUNTER — Encounter: Payer: Self-pay | Admitting: Family Medicine

## 2018-06-10 VITALS — BP 128/80 | HR 92 | Temp 98.4°F | Resp 16 | Ht 61.0 in | Wt 164.0 lb

## 2018-06-10 DIAGNOSIS — B079 Viral wart, unspecified: Secondary | ICD-10-CM | POA: Diagnosis not present

## 2018-06-10 DIAGNOSIS — F988 Other specified behavioral and emotional disorders with onset usually occurring in childhood and adolescence: Secondary | ICD-10-CM | POA: Diagnosis not present

## 2018-06-10 DIAGNOSIS — R4689 Other symptoms and signs involving appearance and behavior: Secondary | ICD-10-CM | POA: Diagnosis not present

## 2018-06-10 DIAGNOSIS — R61 Generalized hyperhidrosis: Secondary | ICD-10-CM | POA: Diagnosis not present

## 2018-06-10 NOTE — Patient Instructions (Signed)
Please have a chest x-ray and labs done at your convenience We froze warts for you today- please let me know if not helpful In that event I will set you up to see dermatology I will work on tracking down some more info about your previous ADHD diagnosis- once we get some information we can start you back on treatment  I don't think that your breast pigment change is cause for alarm- please observe this, and let me know if persistent

## 2018-06-10 NOTE — Progress Notes (Signed)
[facility] at [facility] [address] [place], [place] [address] [phone]  Date:  06/10/2018   Name:  Lori Adams   DOB:  16-Jan-1996   MRN:  [id number]  PCP:  Pearline Cables, MD    Chief Complaint: wart removal and Breast Changes (change in size and color, noticied 2 days ago)   History of Present Illness:  Lori Adams is a 23 y.o. very pleasant female patient who presents with the following:  In person visit today for concern of a wart We froze a wart on her left thumb last August, it seemed to nearly resolve but then came back She now has concern of warts on other fingers as well  She has noted some night sweats and chills over the last 2-3 months.  May occur a few times a week.  She will wake up sweaty- not soaked, but she will wake up cold She has not noted any fever She did have body aches and felt really tired a week ago- this lasted for a couple of days.   She checked her temp and it was ok No cough  No ST She notes that 2 weeks ago she had 3 night sweats, more generally may occur twice a week She is on OCP-the brain was change about 5 months ago, but no more recent changes.   No out of the country traveling recently She is not thought to be in high risk for tuberculosis or HIV  She also reports that she was took Vyvanse for ADD several years ago- she stopped taking it as she was in college, and did not want to keep up with follow-up visits She reports being dx with adhd in 2014 per a psychiatrist in [place]- she was seeing Dr. Charlene Brooke-  She was getting her vyvanse filled at a [facility] pharmacy but is not sure which 1 She took the vyvanse just for a few months, stopped using it in 2015 I have advised her that I do need some documentation of her previous diagnosis or treatment.  We will work on finding this  She notes a difficult time concentrating, multitasking, tends to be forgetful She feels like it is  difficult to gather her thoughts and get motivated to complete the task Her attention span is short In her day to day life she has a harder time studying - she is trying to study for the LSAT  Also, 2 days ago she noted a change in both of her nipples She feels like the skin pigment is different around the areolas bilaterally  She has not been SX for nearly a year so we are confident that she is not pregnant  Patient Active Problem List   Diagnosis Date Noted  . Hepatitis B immune 03/27/2017  . Irregular menses 10/09/2016  . PCOS (polycystic ovarian syndrome) 10/09/2016  . Weight gain 10/09/2016    Past Medical History:  Diagnosis Date  . PCOS (polycystic ovarian syndrome)     Past Surgical History:  Procedure Laterality Date  . NO PAST SURGERIES      Social History   Tobacco Use  . Smoking status: Never Smoker  . Smokeless tobacco: Never Used  Substance Use Topics  . Alcohol use: Yes    Comment: rare  . Drug use: Yes    Types: Marijuana    No family history on file.  No Known Allergies  Medication list has been reviewed  and updated.  Current Outpatient Medications on File Prior to Visit  Medication Sig Dispense Refill  . LO LOESTRIN FE 1 MG-10 MCG / 10 MCG tablet      No current facility-administered medications on file prior to visit.     Review of Systems:  No fever or chills except for night sweats.  No cough  Physical Examination: Vitals:   06/10/18 1534  BP: 128/80  Pulse: 92  Resp: 16  Temp: 98.4 F (36.9 C)  SpO2: 98%   Vitals:   06/10/18 1534  Weight: 164 lb (74.4 kg)  Height: 5\' 1"  (1.549 m)   Body mass index is 30.99 kg/m. Ideal Body Weight: Weight in (lb) to have BMI = 25: 132  GEN: WDWN, NAD, Non-toxic, A & O x 3, well-appearing young woman.  Overweight HEENT: Atraumatic, Normocephalic. Neck supple. No masses, No LAD. Ears and Nose: No external deformity. CV: RRR, No M/G/R. No JVD. No thrill. No extra heart sounds. PULM: CTA  B, no wheezes, crackles, rhonchi. No retractions. No resp. distress. No accessory muscle use. ABD: S, NT, ND, +BS. No rebound. No HSM. EXTR: No c/c/e NEURO Normal gait.  PSYCH: Normally interactive. Conversant. Not depressed or anxious appearing.  Calm demeanor.  Breast exam: Normal breast, no masses or dimpling.  She has perhaps some subtle blurring of the demarcation of the areola bilaterally.  However nothing that I would call abnormal.  Perhaps due to mild skin irritation  She has a likely wart adjacent to the nailbed on the left thumb.  She points out small areas of pigmentation on the left index and right index fingers.  Also at the cuticle.  I am not sure if these are actually warts, or just areas of skin irritation.  She desires cryotherapy which I do not feel be harmful  Cryotherapy x3 each to these 3 sites  Assessment and Plan: Night sweats - Plan: DG Chest 2 View, CBC, Comprehensive metabolic panel, HIV Antibody (routine testing w rflx), TSH  Attention deficit disorder (ADD) without hyperactivity  Concern about appearance of breast  Viral wart on finger  In person visit for several concerns today Cryotherapy for wart on thumb as above.  She has concern about a couple of other areas on her fingers.  I wonder if she may be picking at these areas.  I advised her to let me know if not resolved with cryotherapy, and that eventually will refer dermatology  Patient reports being diagnosed with ADD in the past, is interested in going back on therapy.  I do not have any documentation of her previous diagnosis or treatment so far.  We are working on getting this  Reassured that I do not think her breast pigment change is anything dangerous.  Guard against irritation or rubbing, let me know if not resolved  Signed Abbe AmsterdamJessica Maudy Yonan, MD

## 2018-06-11 ENCOUNTER — Other Ambulatory Visit: Payer: Self-pay

## 2018-06-11 ENCOUNTER — Other Ambulatory Visit (INDEPENDENT_AMBULATORY_CARE_PROVIDER_SITE_OTHER): Payer: BC Managed Care – PPO

## 2018-06-11 ENCOUNTER — Emergency Department (HOSPITAL_BASED_OUTPATIENT_CLINIC_OR_DEPARTMENT_OTHER): Admit: 2018-06-11 | Payer: BC Managed Care – PPO

## 2018-06-11 ENCOUNTER — Encounter: Payer: Self-pay | Admitting: Family Medicine

## 2018-06-11 ENCOUNTER — Emergency Department (HOSPITAL_BASED_OUTPATIENT_CLINIC_OR_DEPARTMENT_OTHER)
Admission: EM | Admit: 2018-06-11 | Discharge: 2018-06-11 | Discharge status: Absent without leave | Payer: BC Managed Care – PPO

## 2018-06-11 ENCOUNTER — Ambulatory Visit (HOSPITAL_BASED_OUTPATIENT_CLINIC_OR_DEPARTMENT_OTHER)
Admission: RE | Admit: 2018-06-11 | Discharge: 2018-06-11 | Disposition: A | Discharge status: Home / Self Care | Payer: BC Managed Care – PPO | Source: Ambulatory Visit | Attending: Family Medicine | Admitting: Family Medicine

## 2018-06-11 ENCOUNTER — Emergency Department (HOSPITAL_BASED_OUTPATIENT_CLINIC_OR_DEPARTMENT_OTHER)
Admit: 2018-06-11 | Discharge: 2018-06-11 | Disposition: A | Discharge status: Home / Self Care | Payer: BC Managed Care – PPO | Attending: Family Medicine | Admitting: Family Medicine

## 2018-06-11 DIAGNOSIS — R239 Unspecified skin changes: Secondary | ICD-10-CM

## 2018-06-11 DIAGNOSIS — R61 Generalized hyperhidrosis: Secondary | ICD-10-CM

## 2018-06-11 LAB — CBC
HCT: 40.9 % (ref 36.0–46.0)
Hemoglobin: 14.2 g/dL (ref 12.0–15.0)
MCHC: 34.7 g/dL (ref 30.0–36.0)
MCV: 84.1 fl (ref 78.0–100.0)
Platelets: 266 10*3/uL (ref 150.0–400.0)
RBC: 4.86 Mil/uL (ref 3.87–5.11)
RDW: 12.2 % (ref 11.5–15.5)
WBC: 7.8 10*3/uL (ref 4.0–10.5)

## 2018-06-11 LAB — COMPREHENSIVE METABOLIC PANEL
ALT: 33 U/L (ref 0–35)
AST: 22 U/L (ref 0–37)
Albumin: 4.6 g/dL (ref 3.5–5.2)
Alkaline Phosphatase: 39 U/L (ref 39–117)
BUN: 9 mg/dL (ref 6–23)
CO2: 30 mEq/L (ref 19–32)
Calcium: 9.5 mg/dL (ref 8.4–10.5)
Chloride: 104 mEq/L (ref 96–112)
Creatinine, Ser: 0.61 mg/dL (ref 0.40–1.20)
GFR: 120.54 mL/min (ref >60.00)
Glucose, Bld: 101 mg/dL — ABNORMAL HIGH (ref 70–99)
Potassium: 4.2 mEq/L (ref 3.5–5.1)
Sodium: 140 mEq/L (ref 135–145)
Total Bilirubin: 0.4 mg/dL (ref 0.2–1.2)
Total Protein: 7.4 g/dL (ref 6.0–8.3)

## 2018-06-11 LAB — TSH: TSH: 0.88 u[IU]/mL (ref 0.35–4.50)

## 2018-06-11 NOTE — Addendum Note (Signed)
Addended by: Harley Alto on: 06/11/2018 11:41 AM   Modules accepted: Orders

## 2018-06-14 NOTE — Telephone Encounter (Signed)
-----   Message from Pearline Cables, MD sent at 06/10/2018  6:10 PM EDT ----- Try to track down ADD treatment documentation

## 2018-06-15 LAB — HIV ANTIBODY (ROUTINE TESTING W REFLEX): HIV 1&2 Ab, 4th Generation: NONREACTIVE

## 2018-06-15 NOTE — Telephone Encounter (Signed)
I called the office of the psychiatrist she had mentioned to me, was told they do not have any records for her

## 2018-06-18 NOTE — Addendum Note (Signed)
Addended by: Abbe Amsterdam C on: 06/18/2018 07:22 AM   Modules accepted: Orders

## 2018-07-28 ENCOUNTER — Encounter: Payer: Self-pay | Admitting: Family Medicine

## 2018-07-28 DIAGNOSIS — F909 Attention-deficit hyperactivity disorder, unspecified type: Secondary | ICD-10-CM | POA: Insufficient documentation

## 2018-09-06 ENCOUNTER — Other Ambulatory Visit: Payer: Self-pay

## 2018-09-06 ENCOUNTER — Telehealth: Payer: Self-pay | Admitting: Medical

## 2018-09-06 ENCOUNTER — Ambulatory Visit (INDEPENDENT_AMBULATORY_CARE_PROVIDER_SITE_OTHER): Payer: BC Managed Care – PPO | Admitting: Medical

## 2018-09-06 ENCOUNTER — Encounter: Payer: Self-pay | Admitting: Medical

## 2018-09-06 DIAGNOSIS — Z20828 Contact with and (suspected) exposure to other viral communicable diseases: Secondary | ICD-10-CM

## 2018-09-06 DIAGNOSIS — Z20822 Contact with and (suspected) exposure to covid-19: Secondary | ICD-10-CM

## 2018-09-06 NOTE — Progress Notes (Signed)
Subjective:    Patient ID: Lori Adams, female    DOB: 05/18/1995, 23 y.o.   MRN: [id number]  HPI  Virtual Visit via Telephone Note  I connected with Lori Adams on 09/06/18 at  2:40 PM EDT by telephone and verified that I am speaking with the correct person using two identifiers.  Location: Patient: home Provider: office   I discussed the limitations, risks, security and privacy concerns of performing an evaluation and management service by telephone and the availability of in person appointments. I also discussed with the patient that there may be a patient responsible charge related to this service. The patient expressed understanding and agreed to proceed.   History of Present Illness:  Pt states she saw recent friend On August 27, 2018. Pt states visit friend 10 days ago. Pt states had mask on for 45 minutes in large room and stayed 6 feet apart. Later they went outside to smoke and took mask off.  Pt friend just got tested positive.   Her friend had lack of taste and smell as well had diarrhea. Pt works at [employer].  Pt reports to me that she is asymptomatic and has been for 10 days since seeing her friend.     I discussed the limitations of evaluation and management by telemedicine and the availability of in person appointments. The patient expressed understanding and agreed to proceed.   Observations/Objective: General-no acute distress, pleasant, oriented. Lungs- on inspection lungs appear unlabored. Neck- no tracheal deviation or jvd on inspection. Neuro- gross motor function appears intact.  Assessment and Plan: Patient has had some potential exposure to COVID through friend.  However she is not reporting any signs or symptoms of presently.  She does know some family members in her house who have potential risk complications if they were to get coverage so she would like testing.  Advised patient to minimize contact with her family and will go ahead and put in  COVID test order.  Explained to [facility]through test center.  Will follow results and recently made aware that it might take up to 7 days to get results back.  Going to send patients work excuse to [employer].  Patient can work virtually from home anyway.  If she has any new signs or symptoms pending results of test asked patient to notify us.  Follow-up as needed.  Esperanza RichtersEdward Cainan Trull, PA-C  Follow Up Instructions:    I discussed the assessment and treatment plan with the patient. The patient was provided an opportunity to ask questions and all were answered. The patient agreed with the plan and demonstrated an understanding of the instructions.   The patient was advised to call back or seek an in-person evaluation if the symptoms worsen or if the condition fails to improve as anticipated.  I provided 15 minutes of non-face-to-face time during this encounter.   Esperanza RichtersEdward Stanley Lyness, PA-C         Review of Systems  Constitutional: Negative for chills, fatigue and fever.  HENT: Negative for congestion, ear discharge and ear pain.   Respiratory: Negative for cough, chest tightness, shortness of breath and wheezing.        No cough reported to me. Saw in c.c note has hx of cough but noted smoker. So sounds like not different than before.  Cardiovascular: Negative for chest pain and palpitations.  Gastrointestinal: Negative for abdominal pain and diarrhea.  Musculoskeletal: Negative for back  pain.  Skin: Negative for rash.  Neurological: Negative for dizziness, seizures, weakness and light-headedness.  Hematological: Negative for adenopathy. Does not bruise/bleed easily.  Psychiatric/Behavioral: Negative for behavioral problems and confusion.     Objective:   Physical Exam        Assessment & Plan:

## 2018-09-06 NOTE — Telephone Encounter (Signed)
Opened to review 

## 2018-09-06 NOTE — Patient Instructions (Signed)
Patient has had some potential exposure to COVID through friend.  However she is not reporting any signs or symptoms of presently.  She does know some family members in her house who have potential risk complications if they were to get coverage so she would like testing.  Advised patient to minimize contact with her family and will go ahead and put in COVID test order.  Explained to patient can get tested at [facility] location at [employer].  Will follow results and recently made aware that it might take up to 7 days to get results back.  Going to send patients work excuse to [employer].  Patient can work virtually from home anyway.  If she has any new signs or symptoms pending results of test asked patient to notify us.  Follow-up as needed.

## 2018-09-07 ENCOUNTER — Other Ambulatory Visit: Payer: Self-pay | Admitting: Family Medicine

## 2018-09-07 DIAGNOSIS — Z20822 Contact with and (suspected) exposure to covid-19: Secondary | ICD-10-CM

## 2018-09-09 LAB — NOVEL CORONAVIRUS, NAA: SARS-CoV-2, NAA: NOT DETECTED

## 2018-09-10 ENCOUNTER — Ambulatory Visit: Payer: Self-pay | Admitting: Family Medicine

## 2018-09-10 NOTE — Telephone Encounter (Signed)
Pt called in and was given message from Mackie Pai, PA-C dated 09/10/2018 at 8:11 AM.  I let her know all this information was on her Mychart if she wanted to review what I just told her and what the nurse from yesterday told her Dorene Ar).  She verbalized understanding and thanked me for my help and will review her Mychart.

## 2018-10-26 NOTE — Progress Notes (Deleted)
[facility] at [facility] [address], [place], [place] [address] [phone]  Date:  10/28/2018   Name:  Lori Adams   DOB:  [birth year]   MRN:  [id number]  PCP:  Darreld Mclean, MD    Chief Complaint: No chief complaint on file.   History of Present Illness:  Lori Adams is a 23 y.o. very pleasant female patient who presents with the following:  Generally healthy young woman here today for Pap smear  Patient Active Problem List   Diagnosis Date Noted  . ADHD 07/28/2018  . Hepatitis B immune 03/27/2017  . Irregular menses 10/09/2016  . PCOS (polycystic ovarian syndrome) 10/09/2016  . Weight gain 10/09/2016    Past Medical History:  Diagnosis Date  . PCOS (polycystic ovarian syndrome)     Past Surgical History:  Procedure Laterality Date  . NO PAST SURGERIES      Social History   Tobacco Use  . Smoking status: Never Smoker  . Smokeless tobacco: Never Used  Substance Use Topics  . Alcohol use: Yes    Comment: rare  . Drug use: Yes    Types: Marijuana    No family history on file.  No Known Allergies  Medication list has been reviewed and updated.  Current Outpatient Medications on File Prior to Visit  Medication Sig Dispense Refill  . LO LOESTRIN FE 1 MG-10 MCG / 10 MCG tablet     . VYVANSE 30 MG capsule Take 30 mg by mouth daily. This popped up for me to fill today when signed lab. I tried to take out but could not. Signed lab and it appears did not send to pharmacy which I did not want to anyway.     No current facility-administered medications on file prior to visit.     Review of Systems:  ***  Physical Examination: There were no vitals filed for this visit. There were no vitals filed for this visit. There is no height or weight on file to calculate BMI. Ideal Body Weight:    ***  Assessment and Plan: ***  Signed Lamar Blinks, MD

## 2018-10-28 ENCOUNTER — Ambulatory Visit: Payer: BC Managed Care – PPO | Admitting: Family Medicine

## 2018-11-09 NOTE — Patient Instructions (Addendum)
It was very nice to see you today, I will be in touch with your pap ASAP You got your tetanus booster today- good for about 10 years   Good luck with your test!!     Health Maintenance, Female Adopting a healthy lifestyle and getting preventive care are important in promoting health and wellness. Ask your health care provider about:  The right schedule for you to have regular tests and exams.  Things you can do on your own to prevent diseases and keep yourself healthy. What should I know about diet, weight, and exercise? Eat a healthy diet   Eat a diet that includes plenty of vegetables, fruits, low-fat dairy products, and lean protein.  Do not eat a lot of foods that are high in solid fats, added sugars, or sodium. Maintain a healthy weight Body mass index (BMI) is used to identify weight problems. It estimates body fat based on height and weight. Your health care provider can help determine your BMI and help you achieve or maintain a healthy weight. Get regular exercise Get regular exercise. This is one of the most important things you can do for your health. Most adults should:  Exercise for at least 150 minutes each week. The exercise should increase your heart rate and make you sweat (moderate-intensity exercise).  Do strengthening exercises at least twice a week. This is in addition to the moderate-intensity exercise.  Spend less time sitting. Even light physical activity can be beneficial. Watch cholesterol and blood lipids Have your blood tested for lipids and cholesterol at 24 years of age, then have this test every 5 years. Have your cholesterol levels checked more often if:  Your lipid or cholesterol levels are high.  You are older than 24 years of age.  You are at high risk for heart disease. What should I know about cancer screening? Depending on your health history and family history, you may need to have cancer screening at various ages. This may include  screening for:  Breast cancer.  Cervical cancer.  Colorectal cancer.  Skin cancer.  Lung cancer. What should I know about heart disease, diabetes, and high blood pressure? Blood pressure and heart disease  High blood pressure causes heart disease and increases the risk of stroke. This is more likely to develop in people who have high blood pressure readings, are of African descent, or are overweight.  Have your blood pressure checked: ? Every 3-5 years if you are 71-77 years of age. ? Every year if you are 68 years old or older. Diabetes Have regular diabetes screenings. This checks your fasting blood sugar level. Have the screening done:  Once every three years after age 61 if you are at a normal weight and have a low risk for diabetes.  More often and at a younger age if you are overweight or have a high risk for diabetes. What should I know about preventing infection? Hepatitis B If you have a higher risk for hepatitis B, you should be screened for this virus. Talk with your health care provider to find out if you are at risk for hepatitis B infection. Hepatitis C Testing is recommended for:  Everyone born from [birth year] through 1966.  Anyone with known risk factors for hepatitis C. Sexually transmitted infections (STIs)  Get screened for STIs, including gonorrhea and chlamydia, if: ? You are sexually active and are younger than 24 years of age. ? You are older than 24 years of age and your health care provider  tells you that you are at risk for this type of infection. ? Your sexual activity has changed since you were last screened, and you are at increased risk for chlamydia or gonorrhea. Ask your health care provider if you are at risk.  Ask your health care provider about whether you are at high risk for HIV. Your health care provider may recommend a prescription medicine to help prevent HIV infection. If you choose to take medicine to prevent HIV, you should first get  tested for HIV. You should then be tested every 3 months for as long as you are taking the medicine. Pregnancy  If you are about to stop having your period (premenopausal) and you may become pregnant, seek counseling before you get pregnant.  Take 400 to 800 micrograms (mcg) of folic acid every day if you become pregnant.  Ask for birth control (contraception) if you want to prevent pregnancy. Osteoporosis and menopause Osteoporosis is a disease in which the bones lose minerals and strength with aging. This can result in bone fractures. If you are 16 years old or older, or if you are at risk for osteoporosis and fractures, ask your health care provider if you should:  Be screened for bone loss.  Take a calcium or vitamin D supplement to lower your risk of fractures.  Be given hormone replacement therapy (HRT) to treat symptoms of menopause. Follow these instructions at home: Lifestyle  Do not use any products that contain nicotine or tobacco, such as cigarettes, e-cigarettes, and chewing tobacco. If you need help quitting, ask your health care provider.  Do not use street drugs.  Do not share needles.  Ask your health care provider for help if you need support or information about quitting drugs. Alcohol use  Do not drink alcohol if: ? Your health care provider tells you not to drink. ? You are pregnant, may be pregnant, or are planning to become pregnant.  If you drink alcohol: ? Limit how much you use to 0-1 drink a day. ? Limit intake if you are breastfeeding.  Be aware of how much alcohol is in your drink. In the [place] one drink equals one 12 oz bottle of beer (355 mL), one 5 oz glass of wine (148 mL), or one 1 oz glass of hard liquor (44 mL). General instructions  Schedule regular health, dental, and eye exams.  Stay current with your vaccines.  Tell your health care provider if: ? You often feel depressed. ? You have ever been abused or do not feel safe at  home. Summary  Adopting a healthy lifestyle and getting preventive care are important in promoting health and wellness.  Follow your health care provider's instructions about healthy diet, exercising, and getting tested or screened for diseases.  Follow your health care provider's instructions on monitoring your cholesterol and blood pressure. This information is not intended to replace advice given to you by your health care provider. Make sure you discuss any questions you have with your health care provider. Document Released: 08/19/2010 Document Revised: 01/27/2018 Document Reviewed: 01/27/2018 Elsevier Patient Education  2020 [employer].

## 2018-11-09 NOTE — Progress Notes (Signed)
[facility] [address] [place], [place] [address] [phone]  Date:  11/11/2018   Name:  Lori Adams   DOB:  1995/08/22   MRN:  [id number]  PCP:  Pearline Cables, MD    Chief Complaint: Annual Exam and Wart Removal   History of Present Illness:  Lori Adams is a 23 y.o. very pleasant female patient who presents with the following:  Generally healthy young woman with history of PCOS, here today for a physical and Pap Last seen by myself in April of this year with concern of night sweats and several other things  She was interested in going back on treatment for ADHD.  She reports being treated with Vyvanse in the past, but I do not have any records unfortunately of her previous treatment or diagnosis.  I had asked her to consult a psychologist for a formal diagnosis prior to my starting treatment  She did get tested and was dx with ADD and social anxiety.  She is now on vyvanse and likes this, it is helping her.  She is going to [facility] her ADD treatment  She was tested for COVID-19 in July, negative CMP, CBC, thyroid done in April Flu vaccine- pt declines today  She became sexually active about 1 year ago, I believe this is her first Pap  Reviewed immunization records gardasil UTD, needs tetanus booster today- she is willing to do this  She has lost some weight intentionally  Wt Readings from Last 3 Encounters:  11/11/18 151 lb (68.5 kg)  06/10/18 164 lb (74.4 kg)  10/01/17 162 lb (73.5 kg)   She is currently studying for the LSAT exam, which she will take next week  Patient Active Problem List   Diagnosis Date Noted  . ADHD 07/28/2018  . Hepatitis B immune 03/27/2017  . Irregular menses 10/09/2016  . PCOS (polycystic ovarian syndrome) 10/09/2016  . Weight gain 10/09/2016    Past Medical History:  Diagnosis Date  . PCOS (polycystic ovarian syndrome)     Past  Surgical History:  Procedure Laterality Date  . NO PAST SURGERIES      Social History   Tobacco Use  . Smoking status: Never Smoker  . Smokeless tobacco: Never Used  Substance Use Topics  . Alcohol use: Yes    Comment: rare  . Drug use: Yes    Types: Marijuana    History reviewed. No pertinent family history.  No Known Allergies  Medication list has been reviewed and updated.  Current Outpatient Medications on File Prior to Visit  Medication Sig Dispense Refill  . VYVANSE 30 MG capsule Take 30 mg by mouth daily. This popped up for me to fill today when signed lab. I tried to take out but could not. Signed lab and it appears did not send to pharmacy which I did not want to anyway.     No current facility-administered medications on file prior to visit.     Review of Systems:  As per HPI- otherwise negative. LMP 10/21/2018 No fever or chills No CP or SOB  Physical Examination: Vitals:   11/11/18 1048  BP: 128/80  Pulse: 82  Resp: 16  Temp: (!) 97.3 F (36.3 C)  SpO2: 98%   Vitals:   11/11/18 1048  Weight: 151 lb (68.5 kg)  Height: 5\' 1"  (1.549 m)   Body mass index is 28.53 kg/m. Ideal Body Weight:  Weight in (lb) to have BMI = 25: 132  GEN: WDWN, NAD, Non-toxic, A & O x 3, mild overweight, looks well  HEENT: Atraumatic, Normocephalic. Neck supple. No masses, No LAD.  TM wnl bilaterally  Ears and Nose: No external deformity. CV: RRR, No M/G/R. No JVD. No thrill. No extra heart sounds. PULM: CTA B, no wheezes, crackles, rhonchi. No retractions. No resp. distress. No accessory muscle use. ABD: S, NT, ND. No rebound. No HSM. EXTR: No c/c/e NEURO Normal gait.  PSYCH: Normally interactive. Conversant. Not depressed or anxious appearing.  Calm demeanor.  Breast: normal exam, no masses/ dimpling/ discharge Pelvic: normal, no vaginal lesions or discharge. Uterus normal, no CMT, no adnexal tendereness or masses  She requests that I freeze a few small warts that  tend to form around her cuticles  VC obtained  cryotherapy to 3 warts x3 cycles each  Pt tolerated well, no complications    Assessment and Plan: Physical exam  Screening for cervical cancer - Plan: Cytology - PAP  Immunization due - Plan: Td vaccine greater than or equal to 7yo preservative free IM  CPE today  Pap  Tetanus booster Labs are UTD She is being treated for her ADD by specialist Will plan further follow- up pending labs.   Signed Lamar Blinks, MD

## 2018-11-11 ENCOUNTER — Encounter: Payer: Self-pay | Admitting: Family Medicine

## 2018-11-11 ENCOUNTER — Ambulatory Visit (INDEPENDENT_AMBULATORY_CARE_PROVIDER_SITE_OTHER): Payer: BC Managed Care – PPO | Admitting: Family Medicine

## 2018-11-11 ENCOUNTER — Other Ambulatory Visit: Payer: Self-pay

## 2018-11-11 ENCOUNTER — Other Ambulatory Visit (HOSPITAL_COMMUNITY)
Admission: RE | Admit: 2018-11-11 | Discharge: 2018-11-11 | Disposition: A | Discharge status: Home / Self Care | Payer: BC Managed Care – PPO | Source: Ambulatory Visit | Attending: Family Medicine | Admitting: Family Medicine

## 2018-11-11 VITALS — BP 128/80 | HR 82 | Temp 97.3°F | Resp 16 | Ht 61.0 in | Wt 151.0 lb

## 2018-11-11 DIAGNOSIS — Z0000 Encounter for general adult medical examination without abnormal findings: Secondary | ICD-10-CM

## 2018-11-11 DIAGNOSIS — Z23 Encounter for immunization: Secondary | ICD-10-CM

## 2018-11-11 DIAGNOSIS — Z124 Encounter for screening for malignant neoplasm of cervix: Secondary | ICD-10-CM

## 2018-11-12 LAB — CYTOLOGY - PAP
Adequacy: ABSENT
Chlamydia: NEGATIVE
Diagnosis: NEGATIVE
Molecular Disclaimer: NEGATIVE
Molecular Disclaimer: NORMAL
Neisseria Gonorrhea: NEGATIVE

## 2018-11-13 ENCOUNTER — Encounter: Payer: Self-pay | Admitting: Family Medicine

## 2019-01-21 ENCOUNTER — Other Ambulatory Visit: Payer: Self-pay

## 2019-01-21 DIAGNOSIS — Z20822 Contact with and (suspected) exposure to covid-19: Secondary | ICD-10-CM

## 2019-01-24 LAB — NOVEL CORONAVIRUS, NAA: SARS-CoV-2, NAA: NOT DETECTED

## 2019-08-19 IMAGING — CR DG CHEST 2V
2 series · 2 of 2 positions shown · non-contrast
Comparison: None.

CLINICAL DATA: Shortness of breath and chest pain for 1 day

EXAM:
CHEST  2 VIEW

[w chest pa]
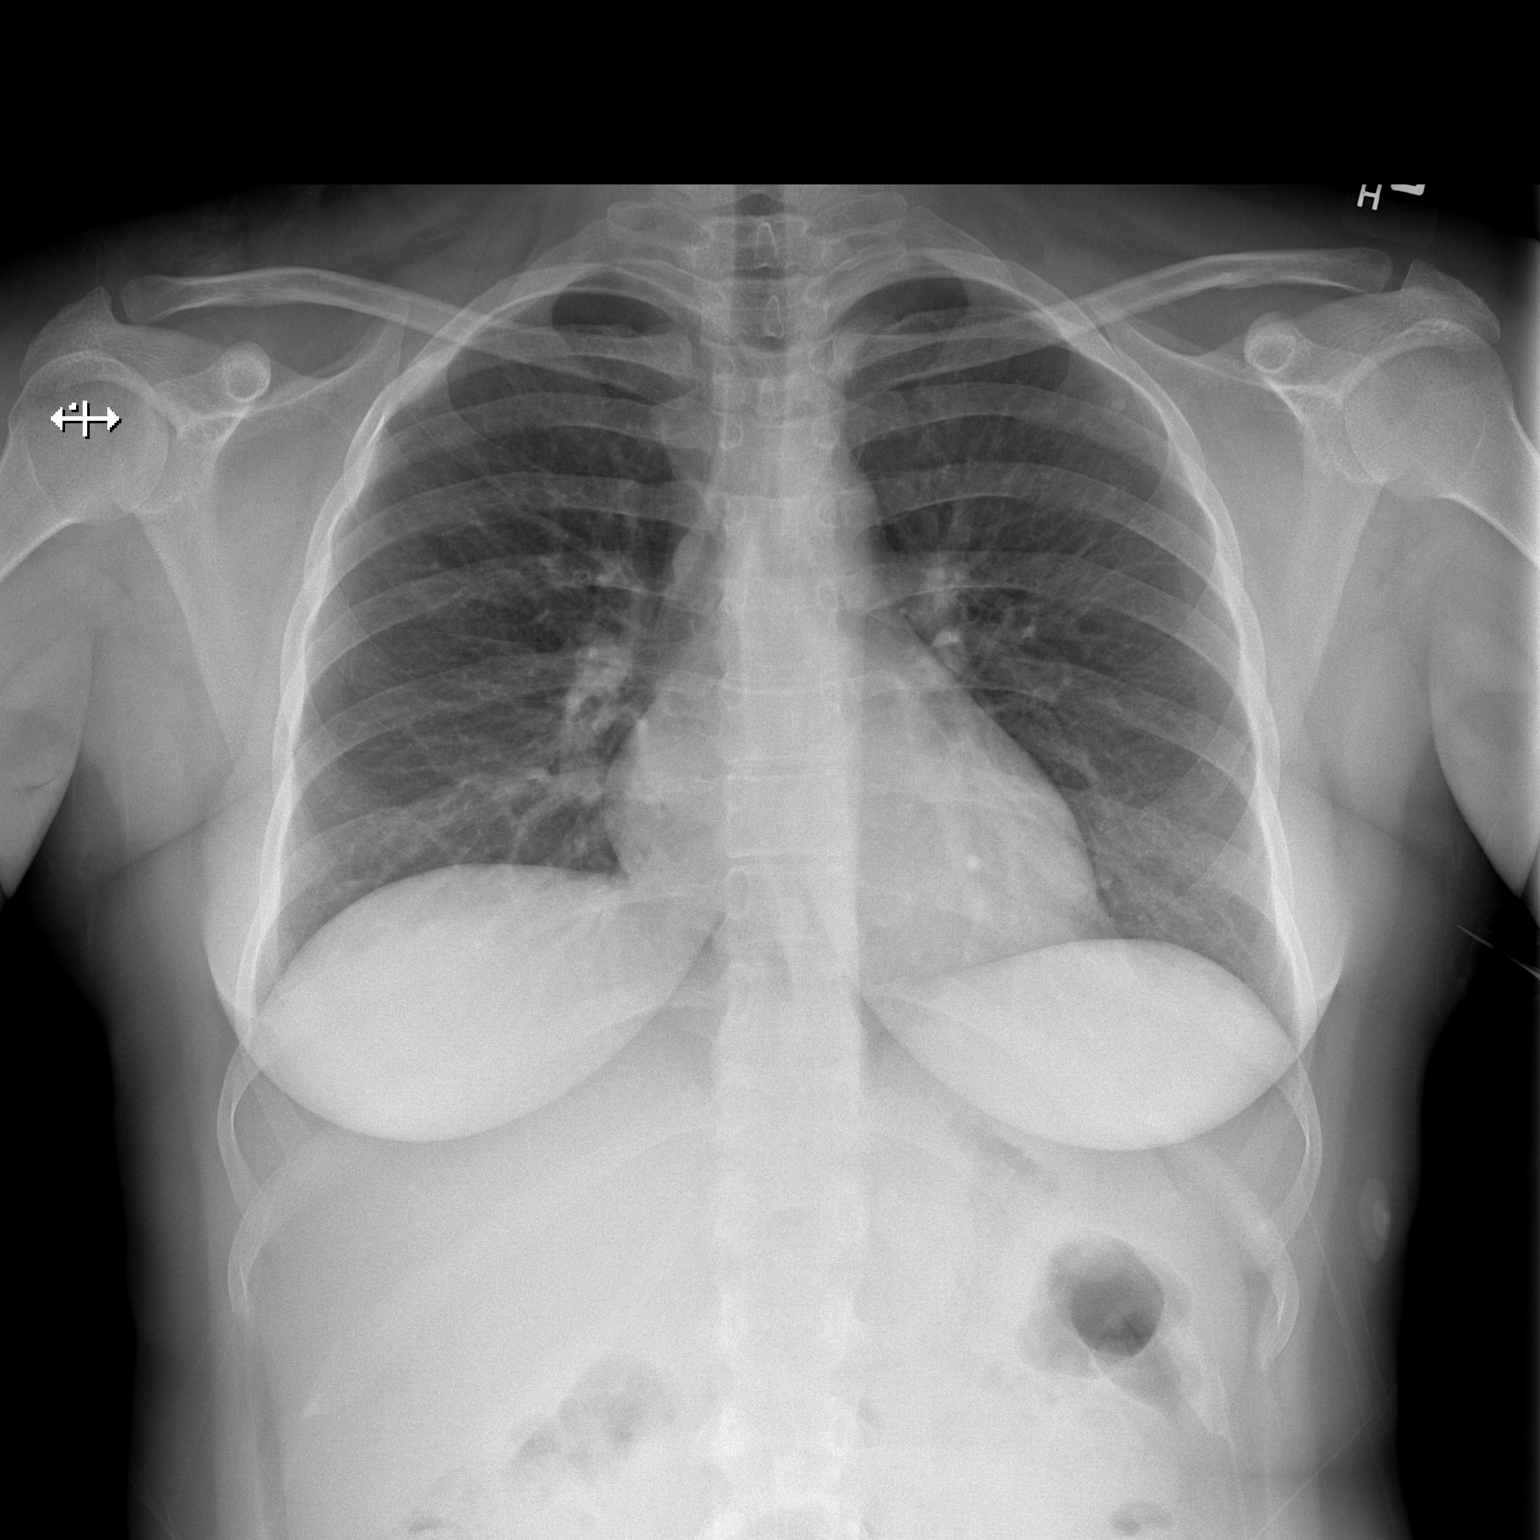

[w chest lat]
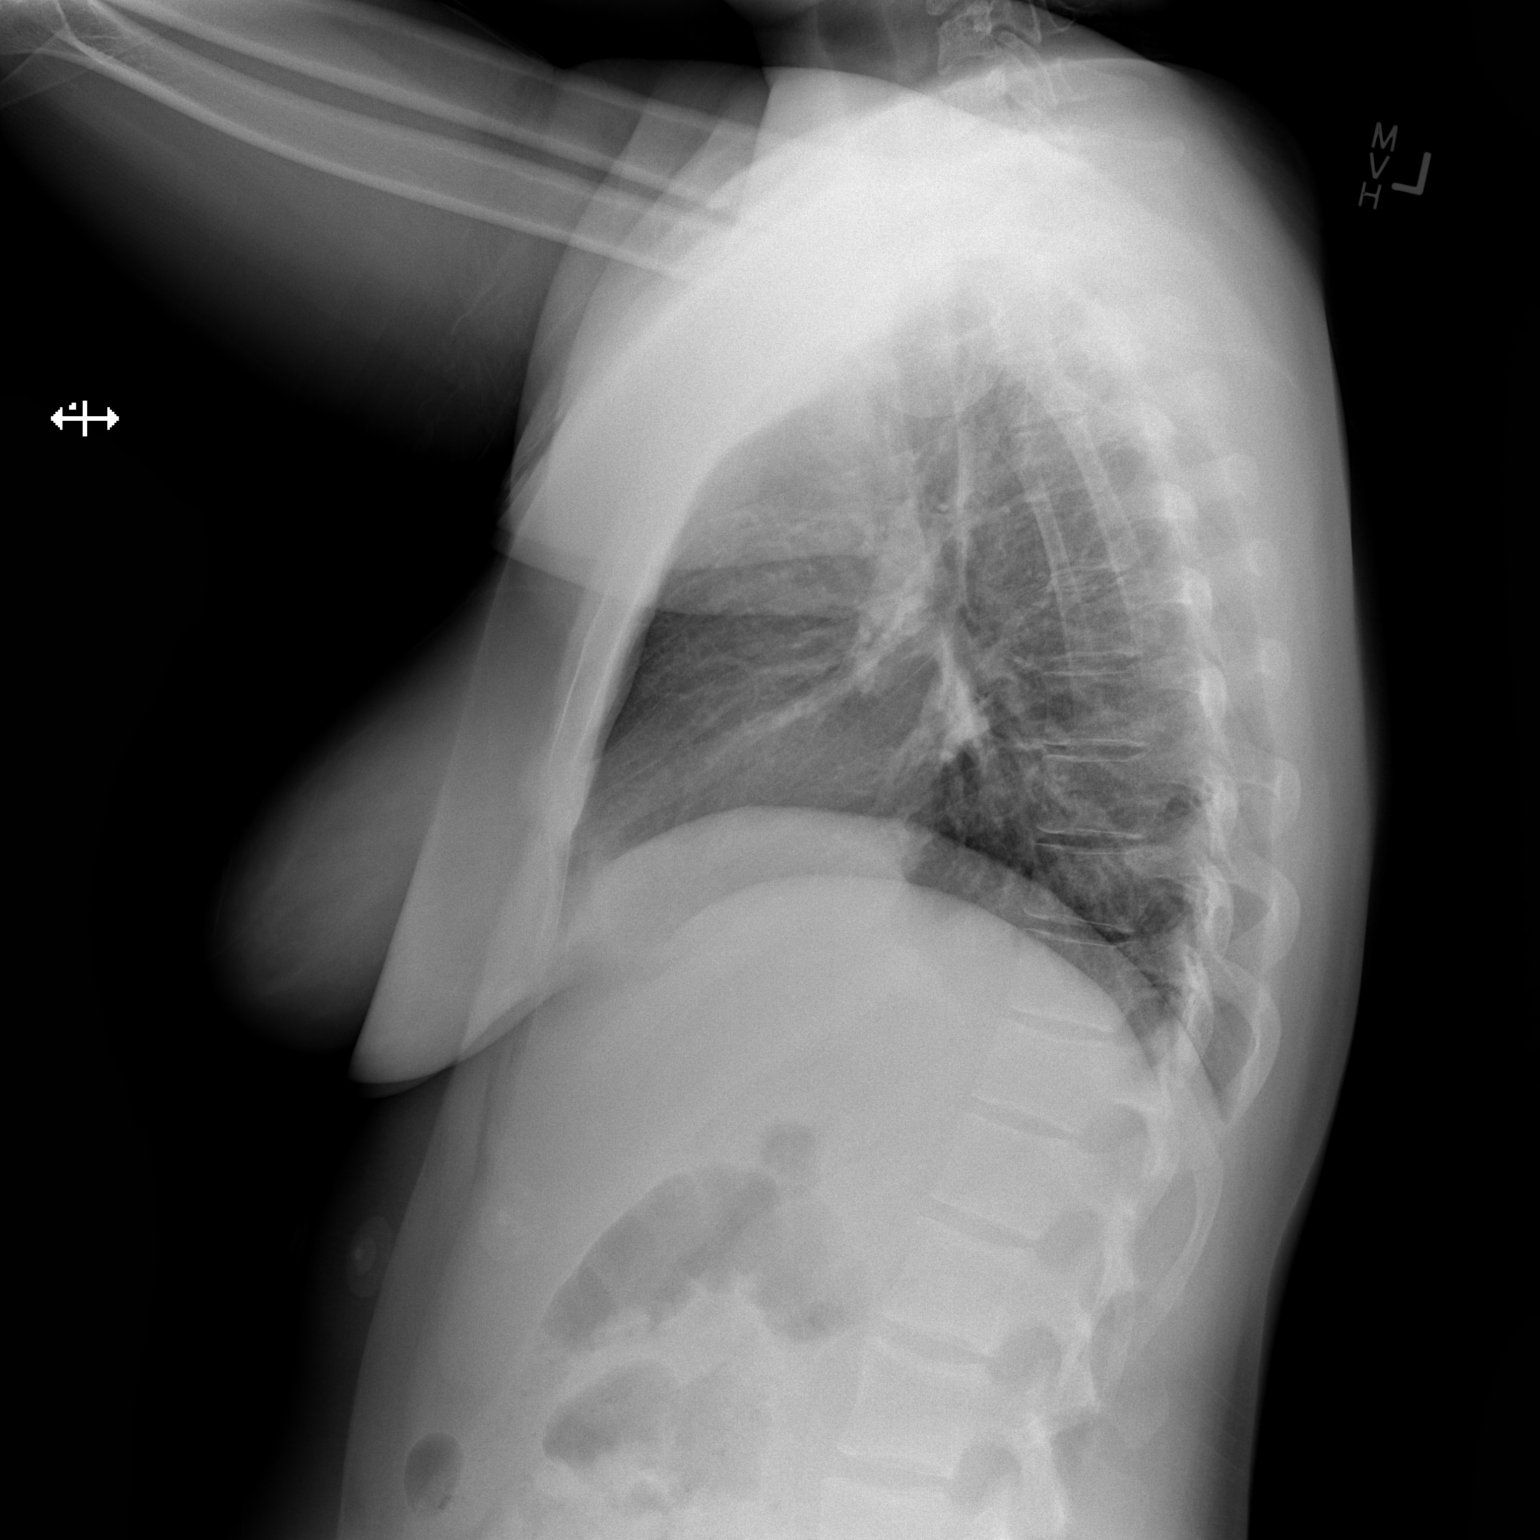

[2 of 2 positions shown; findings below may reference images not displayed]

FINDINGS: The heart size and mediastinal contours are within normal limits.
Both lungs are clear. The visualized skeletal structures are
unremarkable.
IMPRESSION: No active cardiopulmonary disease.

## 2019-10-28 ENCOUNTER — Other Ambulatory Visit: Payer: Self-pay

## 2019-10-28 ENCOUNTER — Ambulatory Visit (HOSPITAL_BASED_OUTPATIENT_CLINIC_OR_DEPARTMENT_OTHER)
Admission: RE | Admit: 2019-10-28 | Discharge: 2019-10-28 | Disposition: A | Discharge status: Home / Self Care | Payer: BC Managed Care – PPO | Source: Ambulatory Visit | Attending: Medical | Admitting: Medical

## 2019-10-28 ENCOUNTER — Ambulatory Visit: Payer: BC Managed Care – PPO | Admitting: Medical

## 2019-10-28 VITALS — BP 135/61 | HR 68 | Temp 98.2°F | Resp 18 | Ht 61.0 in | Wt 140.0 lb

## 2019-10-28 DIAGNOSIS — R739 Hyperglycemia, unspecified: Secondary | ICD-10-CM

## 2019-10-28 DIAGNOSIS — M5410 Radiculopathy, site unspecified: Secondary | ICD-10-CM

## 2019-10-28 DIAGNOSIS — R1111 Vomiting without nausea: Secondary | ICD-10-CM

## 2019-10-28 DIAGNOSIS — M542 Cervicalgia: Secondary | ICD-10-CM

## 2019-10-28 DIAGNOSIS — R5383 Other fatigue: Secondary | ICD-10-CM | POA: Diagnosis not present

## 2019-10-28 MED ORDER — MELOXICAM 7.5 MG PO TABS: ORAL_TABLET | ORAL | 0 refills | Status: DC

## 2019-10-28 MED ORDER — CYCLOBENZAPRINE HCL 5 MG PO TABS: 5.0000 mg | ORAL_TABLET | Freq: Every day | ORAL | 0 refills | Status: DC

## 2019-10-28 NOTE — Patient Instructions (Addendum)
For your neck pain and radiating pain will get xray of c spine. Will rx meloxicam nsaid. Also flexeril 5 mg to take at night. Rx advisement on med side effects.  Possible CTS component. Nsaids can help.  Vomit episode rare. Some association with pain which can happen.(no preg test since not active for 2 years)  Pt mentions some fatigue and mild high sugar. So did below listed labs.  Follow up in 10-14 days with pcp or prn.  If pain not improving go ahead and let me know as sport med referral option.

## 2019-10-28 NOTE — Progress Notes (Signed)
Subjective:    Patient ID: Lori Adams, female    DOB: 1995/10/23, 24 y.o.   MRN: [id number]  HPI  Pt in with one week of neck pain. Pain with some radiating pain down her rt arm. Pain started a week ago. No injury or fall. 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 Pain when she was turning her head.  Pt states random vomit episode one vomit episode last week.   Pt lmp was 10-18-2019.Not sexually active. Last time 2 years ago. So after discussion with pt she declined preg test.     Review of Systems  Constitutional: Negative for chills, fatigue and fever.  HENT: Negative for congestion and ear discharge.   Respiratory: Negative for cough, choking and wheezing.   Cardiovascular: Negative for chest pain and palpitations.  Gastrointestinal: Positive for vomiting. Negative for abdominal pain, blood in stool, constipation and nausea.  Genitourinary: Negative for difficulty urinating, dysuria and urgency.  Musculoskeletal: Negative for myalgias.  Skin: Negative for rash.  Neurological: Negative for dizziness, weakness, numbness and headaches.  Hematological: Negative for adenopathy. Does not bruise/bleed easily.    Past Medical History:  Diagnosis Date   PCOS (polycystic ovarian syndrome)      Social History   Socioeconomic History   Marital status: Single    Spouse name: Not on file   Number of children: Not on file   Years of education: Not on file   Highest education level: Not on file  Occupational History   Not on file  Tobacco Use   Smoking status: Never Smoker   Smokeless tobacco: Never Used  Substance and Sexual Activity   Alcohol use: Yes    Comment: rare   Drug use: Yes    Types: Marijuana   Sexual activity: Yes    Birth control/protection: Pill  Other Topics Concern   Not on file  Social History Narrative   Not on file   Social Determinants of Health   Financial Resource Strain:    Difficulty of Paying Living Expenses: Not on file    Food Insecurity:    Worried About Running Out of Food in the Last Year: Not on file   Ran Out of Food in the Last Year: Not on file  Transportation Needs:    Lack of Transportation (Medical): Not on file   Lack of Transportation (Non-Medical): Not on file  Physical Activity:    Days of Exercise per Week: Not on file   Minutes of Exercise per Session: Not on file  Stress:    Feeling of Stress : Not on file  Social Connections:    Frequency of Communication with Friends and Family: Not on file   Frequency of Social Gatherings with Friends and Family: Not on file   Attends Religious Services: Not on file   Active Member of Clubs or Organizations: Not on file   Attends Banker Meetings: Not on file   Marital Status: Not on file  Intimate Partner Violence:    Fear of Current or Ex-Partner: Not on file   Emotionally Abused: Not on file   Physically Abused: Not on file   Sexually Abused: Not on file    Past Surgical History:  Procedure Laterality Date   NO PAST SURGERIES      No family history on file.  No Known Allergies  Current Outpatient Medications on File Prior to Visit  Medication Sig Dispense Refill   JORNAY PM 60 MG CP24 Take by mouth at  bedtime.     VYVANSE 30 MG capsule Take 30 mg by mouth daily. This popped up for me to fill today when signed lab. I tried to take out but could not. Signed lab and it appears did not send to pharmacy which I did not want to anyway. (Patient not taking: Reported on 10/28/2019)     No current facility-administered medications on file prior to visit.    BP 135/61    Pulse 68    Temp 98.2 F (36.8 C) (Oral)    Resp 18    Ht 5\' 1"  (1.549 m)    Wt 140 lb 0.6 oz (63.5 kg)    LMP 10/17/2019    SpO2 100%    BMI 26.46 kg/m       Objective:   Physical Exam  General Mental Status- Alert. General Appearance- Not in acute distress.   Skin General: Color- Normal Color. Moisture- Normal Moisture.  Neck No  mid cspine pain. Rt trapezius tender to palpation.  Chest and Lung Exam Auscultation: Breath Sounds:-Normal.  Cardiovascular Auscultation:Rythm- Regular. Murmurs & Other Heart Sounds:Auscultation of the heart reveals- No Murmurs.  Abdomen Inspection:-Inspeection Normal. Palpation/Percussion:Note:No mass. Palpation and Percussion of the abdomen reveal- Non Tender, Non Distended + BS, no rebound or guarding.    Neurologic Cranial Nerve exam:- CN III-XII intact(No nystagmus), symmetric smile. Strength:- 5/5 equal and symmetric strength both upper and lower extremities.  Upper ext-hand sharp and dull discrimination intact bilaterally. Wrist manipulation cause pinkey numbness rt side    Assessment & Plan:  For your neck pain and radiating pain will get xray of c spine. Will rx meloxicam nsaid. Also flexeril 5 mg to take at night. Rx advisement on med side effects.  Possible CTS component. Nsaids can help.  Vomit episode rare. Some association with pain.  Pt mentions some fatigue and mild high sugar. So did below listed labs.  Follow up in 10-14 days with pcp or prn.  If pain not improving go ahead and let me know as sport med referral option.  11-12-1978, PA-C

## 2019-11-01 ENCOUNTER — Encounter: Payer: Self-pay | Admitting: Medical

## 2019-11-01 LAB — CBC WITH DIFFERENTIAL/PLATELET
Absolute Monocytes: 613 cells/uL (ref 200–950)
Basophils Absolute: 44 cells/uL (ref 0–200)
Basophils Relative: 0.6 %
Eosinophils Absolute: 110 cells/uL (ref 15–500)
Eosinophils Relative: 1.5 %
HCT: 42.2 % (ref 35.0–45.0)
Hemoglobin: 14.5 g/dL (ref 11.7–15.5)
Lymphs Abs: 1978 cells/uL (ref 850–3900)
MCH: 29.4 pg (ref 27.0–33.0)
MCHC: 34.4 g/dL (ref 32.0–36.0)
MCV: 85.6 fL (ref 80.0–100.0)
MPV: 12 fL (ref 7.5–12.5)
Monocytes Relative: 8.4 %
Neutro Abs: 4555 cells/uL (ref 1500–7800)
Neutrophils Relative %: 62.4 %
Platelets: 351 10*3/uL (ref 140–400)
RBC: 4.93 10*6/uL (ref 3.80–5.10)
RDW: 12.3 % (ref 11.0–15.0)
Total Lymphocyte: 27.1 %
WBC: 7.3 10*3/uL (ref 3.8–10.8)

## 2019-11-01 LAB — COMPREHENSIVE METABOLIC PANEL
AG Ratio: 1.8 (calc) (ref 1.0–2.5)
ALT: 23 U/L (ref 6–29)
AST: 16 U/L (ref 10–30)
Albumin: 4.6 g/dL (ref 3.6–5.1)
Alkaline phosphatase (APISO): 34 U/L (ref 31–125)
BUN: 10 mg/dL (ref 7–25)
CO2: 27 mmol/L (ref 20–32)
Calcium: 9.5 mg/dL (ref 8.6–10.2)
Chloride: 105 mmol/L (ref 98–110)
Creat: 0.65 mg/dL (ref 0.50–1.10)
Globulin: 2.6 g/dL (calc) (ref 1.9–3.7)
Glucose, Bld: 87 mg/dL (ref 65–99)
Potassium: 4.4 mmol/L (ref 3.5–5.3)
Sodium: 139 mmol/L (ref 135–146)
Total Bilirubin: 0.5 mg/dL (ref 0.2–1.2)
Total Protein: 7.2 g/dL (ref 6.1–8.1)

## 2019-11-01 LAB — HEMOGLOBIN A1C
Hgb A1c MFr Bld: 5.5 % of total Hgb (ref <5.7)
Mean Plasma Glucose: 111 (calc)
eAG (mmol/L): 6.2 (calc)

## 2019-11-01 LAB — VITAMIN B12: Vitamin B-12: 515 pg/mL (ref 200–1100)

## 2019-11-01 LAB — IRON: Iron: 99 ug/dL (ref 40–190)

## 2019-11-01 LAB — VITAMIN B1: Vitamin B1 (Thiamine): 11 nmol/L (ref 8–30)

## 2019-11-01 LAB — LIPASE: Lipase: 24 U/L (ref 7–60)

## 2019-11-02 ENCOUNTER — Telehealth: Payer: Self-pay | Admitting: Medical

## 2019-11-02 DIAGNOSIS — M5410 Radiculopathy, site unspecified: Secondary | ICD-10-CM

## 2019-11-02 DIAGNOSIS — M542 Cervicalgia: Secondary | ICD-10-CM

## 2019-11-02 NOTE — Telephone Encounter (Signed)
Sports med referral placed.

## 2019-11-25 ENCOUNTER — Ambulatory Visit: Payer: BC Managed Care – PPO | Admitting: Family Medicine

## 2019-11-25 ENCOUNTER — Encounter: Payer: Self-pay | Admitting: Family Medicine

## 2019-11-25 ENCOUNTER — Other Ambulatory Visit: Payer: Self-pay

## 2019-11-25 VITALS — BP 114/79 | HR 92 | Ht 62.0 in | Wt 140.0 lb

## 2019-11-25 DIAGNOSIS — M357 Hypermobility syndrome: Secondary | ICD-10-CM

## 2019-11-25 DIAGNOSIS — M542 Cervicalgia: Secondary | ICD-10-CM | POA: Diagnosis not present

## 2019-11-25 NOTE — Assessment & Plan Note (Signed)
Beighton score 8/9 - counseled on supportive care
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
## 2019-11-25 NOTE — Patient Instructions (Signed)
Nice to meet you Please try heat  Please try the exercises  Let us know about the standing desk  Physical therapy will give you a call   Please send me a message in MyChart with any questions or updates.  Please see me back in 4 weeks.   --Dr. Jordan Likes

## 2019-11-25 NOTE — Assessment & Plan Note (Signed)
She does have hypermobility which is likely contributing to some of her ongoing symptoms.   -Counseled on home exercise therapy and supportive care. -Referral to physical therapy. -Could consider imaging if needed.

## 2019-11-28 ENCOUNTER — Encounter: Payer: Self-pay | Admitting: Family Medicine

## 2019-12-16 ENCOUNTER — Ambulatory Visit: Payer: BC Managed Care – PPO | Admitting: Rehabilitative and Restorative Service Providers"

## 2019-12-28 ENCOUNTER — Ambulatory Visit: Payer: BC Managed Care – PPO | Admitting: Rehabilitative and Restorative Service Providers"

## 2019-12-30 ENCOUNTER — Ambulatory Visit: Payer: BC Managed Care – PPO | Admitting: Family Medicine

## 2020-06-06 NOTE — Patient Instructions (Incomplete)
Good to see you again today  

## 2020-06-06 NOTE — Progress Notes (Deleted)
[facility] [address] [place], [place] [address] [phone]  Date:  06/07/2020   Name:  Lori Adams   DOB:  11-05-1995   MRN:  [id number]  PCP:  Pearline Cables, MD    Chief Complaint: No chief complaint on file.   History of Present Illness:  Lori Adams is a 25 y.o. very pleasant female patient who presents with the following:  Here today seeking STI screening- generally in good health. History of PCOS nad ADHD Last seen by myself for a physical in 10/2018  Pap 10/2018: Chlamydia Negative   Neisseria Gonorrhea Negative   Adequacy Satisfactory for evaluation; transformation zone component ABSENT.   Diagnosis - Negative for intraepithelial lesion or malignancy (NILM)    HPV vaccine covid vaccine  Patient Active Problem List   Diagnosis Date Noted  . Neck pain 11/25/2019  . 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 ADHD 07/28/2018  . Hepatitis B immune 03/27/2017  . Irregular menses 10/09/2016  . PCOS (polycystic ovarian syndrome) 10/09/2016  . Weight gain 10/09/2016    Past Medical History:  Diagnosis Date  . PCOS (polycystic ovarian syndrome)     Past Surgical History:  Procedure Laterality Date  . NO PAST SURGERIES      Social History   Tobacco Use  . Smoking status: Never Smoker  . Smokeless tobacco: Never Used  Substance Use Topics  . Alcohol use: Yes    Comment: rare  . Drug use: Yes    Types: Marijuana    No family history on file.  No Known Allergies  Medication list has been reviewed and updated.  Current Outpatient Medications on File Prior to Visit  Medication Sig Dispense Refill  . cyclobenzaprine (FLEXERIL) 5 MG tablet Take 1 tablet (5 mg total) by mouth at bedtime. 10 tablet 0  . JORNAY PM 60 MG CP24 Take by mouth at bedtime.    . meloxicam (MOBIC) 7.5 MG tablet 1-2 tab po q day 20 tablet 0  . VYVANSE 30 MG capsule Take 30 mg by mouth daily. This popped up for  me to fill today when signed lab. I tried to take out but could not. Signed lab and it appears did not send to pharmacy which I did not want to anyway. (Patient not taking: Reported on 10/28/2019)     No current facility-administered medications on file prior to visit.    Review of Systems:  As per HPI- otherwise negative.   Physical Examination: There were no vitals filed for this visit. There were no vitals filed for this visit. There is no height or weight on file to calculate BMI. Ideal Body Weight:    GEN: no acute distress. HEENT: Atraumatic, Normocephalic.  Ears and Nose: No external deformity. CV: RRR, No M/G/R. No JVD. No thrill. No extra heart sounds. PULM: CTA B, no wheezes, crackles, rhonchi. No retractions. No resp. distress. No accessory muscle use. ABD: S, NT, ND, +BS. No rebound. No HSM. EXTR: No c/c/e PSYCH: Normally interactive. Conversant.    Assessment and Plan: ***  This visit occurred during the SARS-CoV-2 public health emergency.  Safety protocols were in place, including screening questions prior to the visit, additional usage of staff PPE, and extensive cleaning of exam room while observing appropriate contact time as indicated for disinfecting solutions.    Signed Abbe Amsterdam, MD

## 2020-06-07 ENCOUNTER — Telehealth: Payer: Self-pay

## 2020-06-07 ENCOUNTER — Ambulatory Visit: Payer: Self-pay | Admitting: Family Medicine

## 2020-06-07 DIAGNOSIS — Z0289 Encounter for other administrative examinations: Secondary | ICD-10-CM

## 2020-06-07 NOTE — Telephone Encounter (Signed)
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  Telephone: [phone]

## 2020-06-07 NOTE — Telephone Encounter (Signed)
called and lvm to resched

## 2020-07-09 ENCOUNTER — Ambulatory Visit: Payer: Self-pay | Admitting: Family Medicine

## 2020-08-08 ENCOUNTER — Ambulatory Visit: Payer: 59 | Admitting: Family Medicine

## 2020-08-19 NOTE — Progress Notes (Deleted)
[facility] [address] [place], [place] [address] [phone]  Date:  08/27/2020   Name:  Lori Adams   DOB:  11-Nov-1995   MRN:  [id number]  PCP:  Pearline Cables, MD    Chief Complaint: No chief complaint on file.   History of Present Illness:  [name] is a 25 y.o. 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  No Known Allergies  Medication list has been reviewed and updated.  Current Outpatient Medications on File Prior to Visit  Medication Sig Dispense Refill   cyclobenzaprine (FLEXERIL) 5 MG tablet Take 1 tablet (5 mg total) by mouth at bedtime. 10 tablet 0   JORNAY PM 60 MG CP24 Take by mouth at bedtime.     meloxicam (MOBIC) 7.5 MG tablet 1-2 tab po q day 20 tablet 0   VYVANSE 30 MG capsule Take 30 mg by mouth daily. This popped up for me to fill today when signed lab. I tried to take out but could not. Signed lab and  it appears did not send to pharmacy which I did not want to anyway. (Patient not taking: Reported on 10/28/2019)     No current facility-administered medications on file prior to visit.    Review of Systems:  As per HPI- otherwise negative.   Physical Examination: There were no vitals filed for this visit. There were no vitals filed for this visit. There is no height or weight on file to calculate BMI. Ideal Body Weight:    GEN: no acute distress. HEENT: Atraumatic, Normocephalic.  Ears and Nose: No external deformity. CV: RRR, No M/G/R. No JVD. No thrill. No extra heart sounds. PULM: CTA B, no wheezes, crackles, rhonchi. No retractions. No resp. distress. No accessory muscle use. ABD: S, NT, ND, +BS. No rebound. No HSM. EXTR: No c/c/e PSYCH: Normally interactive. Conversant. '  Assessment and Plan: *** Seen today for CPE Encouraged healthy diet and exercise routine This visit occurred during the SARS-CoV-2 public health emergency.  Safety protocols were in place, including screening questions prior to the visit, additional usage of staff PPE, and extensive cleaning of exam room while observing appropriate contact time as indicated for disinfecting solutions.   Signed Abbe Amsterdam, MD

## 2020-08-27 ENCOUNTER — Ambulatory Visit: Payer: 59 | Admitting: Family Medicine

## 2020-09-18 NOTE — Progress Notes (Addendum)
[facility] [address] [place], [place] [address] [phone]  Date:  09/24/2020   Name:  Lori Adams   DOB:  1996-02-01   MRN:  [id number]  PCP:  Pearline Cables, MD    Chief Complaint: Annual Exam (With Blood work )   History of Present Illness:  Lori Adams is a 25 y.o. 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  She has gotten her period several times since then and does not suspect pregnancy. She feels like she is having more skin break-outs and is "more hairy" since she used Plan B.  I reassured her that this medication is not still in her system several months later. She is getting regular menses  I offered to start her on a reliable contraceptive, for the time being she declines as she does not regularly having intercourse.  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  No Known Allergies  Medication list has been reviewed and updated.  No current outpatient medications on file prior to visit.   No current facility-administered medications on file prior to visit.    Review of Systems:  As per HPI- otherwise negative.   Physical Examination: Vitals:   09/24/20 0830  BP: 122/80  Pulse: 85  Temp: 98.3 F (36.8 C)  SpO2: 99%   Vitals:   09/24/20 0830  Weight: 164 lb 3.2 oz (74.5 kg)  Height: 5\' 1"  (1.549 m)   Body mass index is 31.03 kg/m. Ideal Body Weight: Weight in (lb) to have BMI = 25: 132  GEN: no acute distress.  Mild obesity, looks well HEENT: Atraumatic, Normocephalic. Bilateral TM wnl, oropharynx normal.  PEERL,EOMI.   Ears and Nose: No external deformity. CV: RRR, No M/G/R. No JVD. No thrill. No extra heart sounds. PULM: CTA B, no wheezes, crackles, rhonchi. No retractions. No resp. distress. No accessory muscle use. ABD: S,  NT, ND, +BS. No rebound. No HSM. EXTR: No c/c/e PSYCH: Normally interactive. Conversant.    Assessment and Plan: Physical exam  PCOS (polycystic ovarian syndrome) - Plan: Comprehensive metabolic panel, Hemoglobin A1c  Elevated blood sugar  Routine screening for STI (sexually transmitted infection) - Plan: Hepatitis C antibody, HIV Antibody (routine testing w rflx), RPR, Urine cytology ancillary only([place])  Screening for hyperlipidemia - Plan: Lipid panel  Screening for thyroid disorder - Plan: TSH  Fatigue, unspecified type - Plan: TSH, VITAMIN D 25 Hydroxy (Vit-D Deficiency, Fractures)  Screening, deficiency anemia, iron - Plan: CBC  Physical exam today Encouraged healthy diet and exercise program  Will plan  further follow- up pending labs. Her COVID-19 series is complete, she does not have the dates on her right now Pap is due next year  This visit occurred during the SARS-CoV-2 public health emergency.  Safety protocols were in place, including screening questions prior to the visit, additional usage of staff PPE, and extensive cleaning of exam room while observing appropriate contact time as indicated for disinfecting solutions.   Signed Abbe Amsterdam, MD   Received her labs as below, message to patient  Results for orders placed or performed in visit on 09/24/20  CBC  Result Value Ref Range   WBC 8.7 4.0 - 10.5 K/uL   RBC 4.75 3.87 - 5.11 Mil/uL   Platelets 292.0 150.0 - 400.0 K/uL   Hemoglobin 13.8 12.0 - 15.0 g/dL   HCT 32.9 51.8 - 84.1 %   MCV 86.0 78.0 - 100.0 fl   MCHC 33.8 30.0 - 36.0 g/dL   RDW 66.0 63.0 - 16.0 %  Comprehensive metabolic panel  Result Value Ref Range   Sodium 138 135 - 145 mEq/L   Potassium 4.1 3.5 - 5.1 mEq/L   Chloride 104 96 - 112 mEq/L   CO2 24 19 - 32 mEq/L   Glucose, Bld 91 70 - 99 mg/dL   BUN 10 6 - 23 mg/dL   Creatinine, Ser 1.09 0.40 - 1.20 mg/dL   Total Bilirubin 0.4 0.2 - 1.2 mg/dL   Alkaline Phosphatase 37 (L) 39 - 117 U/L   AST 15 0 - 37 U/L   ALT 18 0 - 35 U/L   Total Protein 7.4 6.0 - 8.3 g/dL   Albumin 4.6 3.5 - 5.2 g/dL   GFR 323.55 >73.22 mL/min   Calcium 9.6 8.4 - 10.5 mg/dL  Hemoglobin G2R  Result Value Ref Range   Hgb A1c MFr Bld 5.7 4.6 - 6.5 %  Lipid panel  Result Value Ref Range   Cholesterol 163 0 - 200 mg/dL   Triglycerides 427.0 (H) 0.0 - 149.0 mg/dL   HDL 62.37 >62.83 mg/dL   VLDL 15.1 0.0 - 76.1 mg/dL   LDL Cholesterol 84 0 - 99 mg/dL   Total CHOL/HDL Ratio 4    NonHDL 121.40   TSH  Result Value Ref Range   TSH 1.39 0.35 - 5.50 uIU/mL  VITAMIN D 25 Hydroxy (Vit-D Deficiency, Fractures)  Result Value Ref Range   VITD 11.08 (L) 30.00 - 100.00 ng/mL

## 2020-09-18 NOTE — Patient Instructions (Addendum)
Good to see you again today- I will be in touch with your labs asap  If you ever decide you would like to be on contraception we are happy to help Have a great time in [place]!

## 2020-09-24 ENCOUNTER — Ambulatory Visit (INDEPENDENT_AMBULATORY_CARE_PROVIDER_SITE_OTHER): Payer: 59 | Admitting: Family Medicine

## 2020-09-24 ENCOUNTER — Other Ambulatory Visit: Payer: Self-pay

## 2020-09-24 ENCOUNTER — Encounter: Payer: Self-pay | Admitting: Family Medicine

## 2020-09-24 ENCOUNTER — Other Ambulatory Visit (HOSPITAL_COMMUNITY)
Admission: RE | Admit: 2020-09-24 | Discharge: 2020-09-24 | Disposition: A | Discharge status: Home / Self Care | Payer: 59 | Source: Ambulatory Visit | Attending: Family Medicine | Admitting: Family Medicine

## 2020-09-24 VITALS — BP 122/80 | HR 85 | Temp 98.3°F | Ht 61.0 in | Wt 164.2 lb

## 2020-09-24 DIAGNOSIS — Z13220 Encounter for screening for lipoid disorders: Secondary | ICD-10-CM | POA: Diagnosis not present

## 2020-09-24 DIAGNOSIS — Z130 Encounter for screening for diseases of the blood and blood-forming organs and certain disorders involving the immune mechanism: Secondary | ICD-10-CM

## 2020-09-24 DIAGNOSIS — E282 Polycystic ovarian syndrome: Secondary | ICD-10-CM | POA: Diagnosis not present

## 2020-09-24 DIAGNOSIS — Z113 Encounter for screening for infections with a predominantly sexual mode of transmission: Secondary | ICD-10-CM | POA: Insufficient documentation

## 2020-09-24 DIAGNOSIS — R739 Hyperglycemia, unspecified: Secondary | ICD-10-CM | POA: Diagnosis not present

## 2020-09-24 DIAGNOSIS — Z1329 Encounter for screening for other suspected endocrine disorder: Secondary | ICD-10-CM | POA: Diagnosis not present

## 2020-09-24 DIAGNOSIS — E559 Vitamin D deficiency, unspecified: Secondary | ICD-10-CM

## 2020-09-24 DIAGNOSIS — Z0000 Encounter for general adult medical examination without abnormal findings: Secondary | ICD-10-CM | POA: Diagnosis not present

## 2020-09-24 DIAGNOSIS — R5383 Other fatigue: Secondary | ICD-10-CM | POA: Diagnosis not present

## 2020-09-24 LAB — COMPREHENSIVE METABOLIC PANEL
ALT: 18 U/L (ref 0–35)
AST: 15 U/L (ref 0–37)
Albumin: 4.6 g/dL (ref 3.5–5.2)
Alkaline Phosphatase: 37 U/L — ABNORMAL LOW (ref 39–117)
BUN: 10 mg/dL (ref 6–23)
CO2: 24 mEq/L (ref 19–32)
Calcium: 9.6 mg/dL (ref 8.4–10.5)
Chloride: 104 mEq/L (ref 96–112)
Creatinine, Ser: 0.62 mg/dL (ref 0.40–1.20)
GFR: 123.05 mL/min (ref >60.00)
Glucose, Bld: 91 mg/dL (ref 70–99)
Potassium: 4.1 mEq/L (ref 3.5–5.1)
Sodium: 138 mEq/L (ref 135–145)
Total Bilirubin: 0.4 mg/dL (ref 0.2–1.2)
Total Protein: 7.4 g/dL (ref 6.0–8.3)

## 2020-09-24 LAB — CBC
HCT: 40.8 % (ref 36.0–46.0)
Hemoglobin: 13.8 g/dL (ref 12.0–15.0)
MCHC: 33.8 g/dL (ref 30.0–36.0)
MCV: 86 fl (ref 78.0–100.0)
Platelets: 292 10*3/uL (ref 150.0–400.0)
RBC: 4.75 Mil/uL (ref 3.87–5.11)
RDW: 12.4 % (ref 11.5–15.5)
WBC: 8.7 10*3/uL (ref 4.0–10.5)

## 2020-09-24 LAB — LIPID PANEL
Cholesterol: 163 mg/dL (ref 0–200)
HDL: 41.8 mg/dL (ref >39.00)
LDL Cholesterol: 84 mg/dL (ref 0–99)
NonHDL: 121.4
Total CHOL/HDL Ratio: 4
Triglycerides: 188 mg/dL — ABNORMAL HIGH (ref 0.0–149.0)
VLDL: 37.6 mg/dL (ref 0.0–40.0)

## 2020-09-24 LAB — HEMOGLOBIN A1C: Hgb A1c MFr Bld: 5.7 % (ref 4.6–6.5)

## 2020-09-24 LAB — VITAMIN D 25 HYDROXY (VIT D DEFICIENCY, FRACTURES): VITD: 11.08 ng/mL — ABNORMAL LOW (ref 30.00–100.00)

## 2020-09-24 LAB — TSH: TSH: 1.39 u[IU]/mL (ref 0.35–5.50)

## 2020-09-24 MED ORDER — VITAMIN D3 1.25 MG (50000 UT) PO CAPS: ORAL_CAPSULE | ORAL | 0 refills | Status: DC

## 2020-09-24 NOTE — Addendum Note (Signed)
Addended by: Abbe Amsterdam C on: 09/24/2020 06:02 PM   Modules accepted: Orders

## 2020-09-25 LAB — URINE CYTOLOGY ANCILLARY ONLY
Chlamydia: NEGATIVE
Comment: NEGATIVE
Comment: NEGATIVE
Comment: NORMAL
Neisseria Gonorrhea: NEGATIVE
Trichomonas: NEGATIVE

## 2020-09-25 LAB — HIV ANTIBODY (ROUTINE TESTING W REFLEX): HIV 1&2 Ab, 4th Generation: NONREACTIVE

## 2020-09-25 LAB — HEPATITIS C ANTIBODY
Hepatitis C Ab: NONREACTIVE
SIGNAL TO CUT-OFF: 0.01 (ref <1.00)

## 2020-09-25 LAB — RPR: RPR Ser Ql: NONREACTIVE

## 2021-05-06 ENCOUNTER — Ambulatory Visit (HOSPITAL_BASED_OUTPATIENT_CLINIC_OR_DEPARTMENT_OTHER)
Admission: RE | Admit: 2021-05-06 | Discharge: 2021-05-06 | Disposition: A | Discharge status: Home / Self Care | Payer: 59 | Source: Ambulatory Visit | Attending: Medical | Admitting: Medical

## 2021-05-06 ENCOUNTER — Ambulatory Visit: Payer: 59 | Admitting: Medical

## 2021-05-06 ENCOUNTER — Ambulatory Visit (INDEPENDENT_AMBULATORY_CARE_PROVIDER_SITE_OTHER): Payer: 59 | Admitting: Medical

## 2021-05-06 ENCOUNTER — Other Ambulatory Visit: Payer: Self-pay

## 2021-05-06 VITALS — BP 127/68 | HR 71 | Temp 98.3°F | Resp 18 | Ht 61.0 in | Wt 159.6 lb

## 2021-05-06 DIAGNOSIS — R0781 Pleurodynia: Secondary | ICD-10-CM

## 2021-05-06 MED ORDER — HYDROCODONE-ACETAMINOPHEN 5-325 MG PO TABS: 1.0000 | ORAL_TABLET | Freq: Four times a day (QID) | ORAL | 0 refills | Status: AC | PRN

## 2021-05-06 MED ORDER — KETOROLAC TROMETHAMINE 60 MG/2ML IM SOLN
60.0000 mg | Freq: Once | INTRAMUSCULAR | Status: AC
  Administered 2021-05-06: 60 mg via INTRAMUSCULAR

## 2021-05-06 MED ORDER — CYCLOBENZAPRINE HCL 5 MG PO TABS: 5.0000 mg | ORAL_TABLET | Freq: Every day | ORAL | 0 refills | Status: AC

## 2021-05-06 MED ORDER — DICLOFENAC SODIUM 75 MG PO TBEC: 75.0000 mg | DELAYED_RELEASE_TABLET | Freq: Two times a day (BID) | ORAL | 0 refills | Status: AC

## 2021-05-06 NOTE — Patient Instructions (Addendum)
You do have rt lower rib pain on palpation and some pleuritic pain. Will get xray of ribs and chest.  ? ?If you pain changes or worsens notify us. If skin over rib becomes red or indurated notify me. If you see any blister out break notify me as well. ? ?Will give toradol 60 mg im to assess response. Starting tomorrow can add on diclofenac. Limited rx norco but want you to first assess response to toradol first before using. ? ?Low number lose flexeril given to use at night.  ? ?Rx advisement on meds given. ? ?Follow up in 7-10 days or sooner if needed. ? ? ? ? ?

## 2021-05-06 NOTE — Addendum Note (Signed)
Addended by: Maximino Sarin on: 05/06/2021 02:38 PM ? ? Modules accepted: Orders ? ?

## 2021-05-06 NOTE — Progress Notes (Signed)
? ?Subjective:  ? ? Patient ID: Lori Adams, female    DOB: 04/21/1995, 26 y.o.   MRN: [id number] ? ?HPI ?Pt in with rt lower rib area pain. Pt states pain is severe at times. Pain since last week wed when just woke up. She states skin feels warm to touch and tender to touch.  ? ?When she takes deep breath will have pain. Also when she turrns thorax pain increases. ? ?Pt states she tried ibuprofen 800 mg with expired hydrocodone and could still feel pain. Pain tab expired in October. ? ?Pt point to bottom 2 ribs as source of pain. No cough. Feels like may be getting a fever. ? ?No rash present.  ? ?Lmp- 3 weeks ago.  ? ?No trauma or fall that she remembers. Pt has been going to gym. She was doing hip thrust exercises at gym day before. ? ? ? ? ?Review of Systems  ?Constitutional:  Negative for chills, fatigue and fever.  ?Eyes:  Negative for pain.  ?Respiratory:  Negative for cough, chest tightness, shortness of breath and wheezing.   ?     Rt lower rib pain/pleuritic pain.  ?Cardiovascular:  Negative for chest pain and palpitations.  ?Gastrointestinal:  Negative for abdominal pain, diarrhea, rectal pain and vomiting.  ?Genitourinary:  Negative for dysuria, flank pain and frequency.  ?Musculoskeletal:  Negative for back pain and joint swelling.  ?Skin:  Negative for rash.  ? ? ?Past Medical History:  ?Diagnosis Date  ? PCOS (polycystic ovarian syndrome)   ? ?  ?Social History  ? ?Socioeconomic History  ? Marital status: Single  ?  Spouse name: Not on file  ? Number of children: Not on file  ? Years of education: Not on file  ? Highest education level: Not on file  ?Occupational History  ? Not on file  ?Tobacco Use  ? Smoking status: Never  ? Smokeless tobacco: Never  ?Substance and Sexual Activity  ? Alcohol use: Yes  ?  Comment: rare  ? Drug use: Yes  ?  Types: Marijuana  ? Sexual activity: Yes  ?  Birth control/protection: Pill  ?Other Topics Concern  ? Not on file  ?Social History Narrative  ? Not on file   ? ?Social Determinants of Health  ? ?Financial Resource Strain: Not on file  ?Food Insecurity: Not on file  ?Transportation Needs: Not on file  ?Physical Activity: Not on file  ?Stress: Not on file  ?Social Connections: Not on file  ?Intimate Partner Violence: Not on file  ? ? ?Past Surgical History:  ?Procedure Laterality Date  ? NO PAST SURGERIES    ? ? ?No family history on file. ? ?No Known Allergies ? ?Current Outpatient Medications on File Prior to Visit  ?Medication Sig Dispense Refill  ? JORNAY PM 40 MG CP24 Take 1 capsule by mouth at bedtime.    ? ?No current facility-administered medications on file prior to visit.  ? ? ?BP 127/68   Pulse 71   Temp 98.3 ?F (36.8 ?C)   Resp 18   Ht 5\' 1"  (1.549 m)   Wt 159 lb 9.6 oz (72.4 kg)   SpO2 98%   BMI 30.16 kg/m?  ?  ? ?   ?Objective:  ? Physical Exam ? ?General ?Mental Status- Alert. General Appearance- Not in acute distress.  ? ?Skin ?General: Color- Normal Color. Moisture- Normal Moisture. ? ?Neck ?No tracheal deviation. No JVD. ? ?Chest and Lung Exam ?Auscultation: ?Breath Sounds:-Normal. ? ?Cardiovascular ?  Auscultation:Rythm- Regular. ?Murmurs & Other Heart Sounds:Auscultation of the heart reveals- No Murmurs. ? ?Abdomen ?Inspection:-Inspeection Normal. ?Palpation/Percussion:Note:No mass. Palpation and Percussion of the abdomen reveal- Non Tender, Non Distended + BS, no rebound or guarding. No rt upper quadrant pain.  ? ?Back- no cva tenderness. ? ? ?Neurologic ?Cranial Nerve exam:- CN III-XII intact(No nystagmus), symmetric smile. ?Strength:- 5/5 equal and symmetric strength both upper and lower extremities.  ? ?Skin- no rash on rt lower ribs. No vesicles. ?   ?Assessment & Plan:  ? ?Patient Instructions  ?You do have rt lower rib pain on palpation and some pleuritic pain. Will get xray of ribs and chest.  ? ?If you pain changes or worsens notify us. If skin over rib becomes red or indurated notify me. If you see any blister out break notify me as  well. ? ?Will give toradol 60 mg im to assess response. Starting tomorrow can add on diclofenac. Limited rx norco but want you to first assess response to toradol first before using. ? ?Low number lose flexeril given to use at night.  ? ?Rx advisement on meds given. ? ?Follow up in 7-10 days or sooner if needed. ? ? ? ? Esperanza Richters, PA-C  ?

## 2021-05-12 ENCOUNTER — Encounter: Payer: Self-pay | Admitting: Medical
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
## 2021-05-14 ENCOUNTER — Other Ambulatory Visit: Payer: Self-pay

## 2021-05-14 ENCOUNTER — Ambulatory Visit (INDEPENDENT_AMBULATORY_CARE_PROVIDER_SITE_OTHER): Payer: 59 | Admitting: Sports Medicine

## 2021-05-14 VITALS — BP 128/78 | HR 97 | Ht 61.0 in | Wt 159.0 lb

## 2021-05-14 DIAGNOSIS — R202 Paresthesia of skin: Secondary | ICD-10-CM

## 2021-05-14 DIAGNOSIS — M545 Low back pain, unspecified: Secondary | ICD-10-CM | POA: Diagnosis not present

## 2021-05-14 DIAGNOSIS — M9908 Segmental and somatic dysfunction of rib cage: Secondary | ICD-10-CM

## 2021-05-14 DIAGNOSIS — M542 Cervicalgia: Secondary | ICD-10-CM

## 2021-05-14 DIAGNOSIS — M546 Pain in thoracic spine: Secondary | ICD-10-CM | POA: Diagnosis not present

## 2021-05-14 DIAGNOSIS — M9905 Segmental and somatic dysfunction of pelvic region: Secondary | ICD-10-CM

## 2021-05-14 DIAGNOSIS — E559 Vitamin D deficiency, unspecified: Secondary | ICD-10-CM

## 2021-05-14 DIAGNOSIS — M9902 Segmental and somatic dysfunction of thoracic region: Secondary | ICD-10-CM

## 2021-05-14 DIAGNOSIS — M9903 Segmental and somatic dysfunction of lumbar region: Secondary | ICD-10-CM

## 2021-05-14 DIAGNOSIS — M9901 Segmental and somatic dysfunction of cervical region: Secondary | ICD-10-CM

## 2021-05-14 DIAGNOSIS — G8929 Other chronic pain: Secondary | ICD-10-CM

## 2021-05-14 DIAGNOSIS — R200 Anesthesia of skin: Secondary | ICD-10-CM

## 2021-05-14 LAB — CBC WITH DIFFERENTIAL/PLATELET
Basophils Absolute: 0.1 10*3/uL (ref 0.0–0.1)
Basophils Relative: 0.7 % (ref 0.0–3.0)
Eosinophils Absolute: 0.1 10*3/uL (ref 0.0–0.7)
Eosinophils Relative: 0.7 % (ref 0.0–5.0)
HCT: 43.5 % (ref 36.0–46.0)
Hemoglobin: 14.8 g/dL (ref 12.0–15.0)
Lymphocytes Relative: 20.4 % (ref 12.0–46.0)
Lymphs Abs: 2.4 10*3/uL (ref 0.7–4.0)
MCHC: 34 g/dL (ref 30.0–36.0)
MCV: 85.9 fl (ref 78.0–100.0)
Monocytes Absolute: 0.9 10*3/uL (ref 0.1–1.0)
Monocytes Relative: 7.3 % (ref 3.0–12.0)
Neutro Abs: 8.3 10*3/uL — ABNORMAL HIGH (ref 1.4–7.7)
Neutrophils Relative %: 70.9 % (ref 43.0–77.0)
Platelets: 336 10*3/uL (ref 150.0–400.0)
RBC: 5.06 Mil/uL (ref 3.87–5.11)
RDW: 12.6 % (ref 11.5–15.5)
WBC: 11.7 10*3/uL — ABNORMAL HIGH (ref 4.0–10.5)

## 2021-05-14 LAB — COMPREHENSIVE METABOLIC PANEL
ALT: 35 U/L (ref 0–35)
AST: 23 U/L (ref 0–37)
Albumin: 5.3 g/dL — ABNORMAL HIGH (ref 3.5–5.2)
Alkaline Phosphatase: 45 U/L (ref 39–117)
BUN: 14 mg/dL (ref 6–23)
CO2: 27 mEq/L (ref 19–32)
Calcium: 10.4 mg/dL (ref 8.4–10.5)
Chloride: 100 mEq/L (ref 96–112)
Creatinine, Ser: 0.76 mg/dL (ref 0.40–1.20)
GFR: 107.79 mL/min (ref >60.00)
Glucose, Bld: 90 mg/dL (ref 70–99)
Potassium: 4.1 mEq/L (ref 3.5–5.1)
Sodium: 137 mEq/L (ref 135–145)
Total Bilirubin: 0.4 mg/dL (ref 0.2–1.2)
Total Protein: 8.6 g/dL — ABNORMAL HIGH (ref 6.0–8.3)

## 2021-05-14 LAB — TSH: TSH: 1.27 u[IU]/mL (ref 0.35–5.50)

## 2021-05-14 LAB — C-REACTIVE PROTEIN: CRP: <1 mg/dL (ref 0.5–20.0)

## 2021-05-14 LAB — SEDIMENTATION RATE: Sed Rate: 43 mm/hr — ABNORMAL HIGH (ref 0–20)

## 2021-05-14 LAB — URIC ACID: Uric Acid, Serum: 6.9 mg/dL (ref 2.4–7.0)

## 2021-05-14 LAB — VITAMIN D 25 HYDROXY (VIT D DEFICIENCY, FRACTURES): VITD: 14.92 ng/mL — ABNORMAL LOW (ref 30.00–100.00)

## 2021-05-14 LAB — FERRITIN: Ferritin: 64.6 ng/mL (ref 10.0–291.0)

## 2021-05-14 MED ORDER — MELOXICAM 15 MG PO TABS: 15.0000 mg | ORAL_TABLET | Freq: Every day | ORAL | 0 refills | Status: AC

## 2021-05-14 NOTE — Patient Instructions (Addendum)
Good to see you  ?- Start meloxicam 15 mg daily x2 weeks.  If still having pain after 2 weeks, complete 3rd-week of meloxicam. May use remaining meloxicam as needed once daily for pain control.  Do not to use additional NSAIDs while taking meloxicam.  May use Tylenol [phone] mg 2 to 3 times a day for breakthrough pain. ?Labs on your way out  ?2 week follow up  ? ?

## 2021-05-15 LAB — HEMOGLOBIN A1C: Hgb A1c MFr Bld: 5.8 % (ref 4.6–6.5)

## 2021-05-17 LAB — ANTI-NUCLEAR AB-TITER (ANA TITER): ANA Titer 1: 1:40 {titer} — ABNORMAL HIGH

## 2021-05-17 LAB — ANA: Anti Nuclear Antibody (ANA): POSITIVE — AB

## 2021-05-17 LAB — CYCLIC CITRUL PEPTIDE ANTIBODY, IGG: Cyclic Citrullin Peptide Ab: <16 UNITS

## 2021-05-17 LAB — RHEUMATOID FACTOR: Rheumatoid fact SerPl-aCnc: <14 IU/mL (ref <14)

## 2021-05-20 ENCOUNTER — Other Ambulatory Visit: Payer: Self-pay | Admitting: Sports Medicine

## 2021-05-20 DIAGNOSIS — M2550 Pain in unspecified joint: Secondary | ICD-10-CM

## 2021-05-20 DIAGNOSIS — M2560 Stiffness of unspecified joint, not elsewhere classified: Secondary | ICD-10-CM

## 2021-05-20 DIAGNOSIS — R768 Other specified abnormal immunological findings in serum: Secondary | ICD-10-CM

## 2021-05-20 NOTE — Progress Notes (Signed)
Referral for rheumatology has been sent in   ?
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
## 2021-05-28 ENCOUNTER — Ambulatory Visit (INDEPENDENT_AMBULATORY_CARE_PROVIDER_SITE_OTHER): Payer: 59 | Admitting: Sports Medicine

## 2021-05-28 VITALS — BP 120/80 | HR 99 | Ht 60.0 in | Wt 170.0 lb

## 2021-05-28 DIAGNOSIS — G8929 Other chronic pain: Secondary | ICD-10-CM

## 2021-05-28 DIAGNOSIS — R200 Anesthesia of skin: Secondary | ICD-10-CM

## 2021-05-28 DIAGNOSIS — R202 Paresthesia of skin: Secondary | ICD-10-CM

## 2021-05-28 DIAGNOSIS — R768 Other specified abnormal immunological findings in serum: Secondary | ICD-10-CM

## 2021-05-28 DIAGNOSIS — M9902 Segmental and somatic dysfunction of thoracic region: Secondary | ICD-10-CM

## 2021-05-28 DIAGNOSIS — M9903 Segmental and somatic dysfunction of lumbar region: Secondary | ICD-10-CM

## 2021-05-28 DIAGNOSIS — M9905 Segmental and somatic dysfunction of pelvic region: Secondary | ICD-10-CM | POA: Diagnosis not present

## 2021-05-28 DIAGNOSIS — M2550 Pain in unspecified joint: Secondary | ICD-10-CM | POA: Diagnosis not present

## 2021-05-28 DIAGNOSIS — M2560 Stiffness of unspecified joint, not elsewhere classified: Secondary | ICD-10-CM

## 2021-05-28 DIAGNOSIS — M546 Pain in thoracic spine: Secondary | ICD-10-CM

## 2021-05-28 NOTE — Patient Instructions (Addendum)
Good to see you ?New referral for rheumatology has been sent in  ?4 week follow up for repeat OMT ?

## 2021-06-11 ENCOUNTER — Encounter: Payer: Self-pay | Admitting: Family Medicine

## 2021-07-02 ENCOUNTER — Ambulatory Visit: Payer: 59 | Admitting: Sports Medicine

## 2021-10-11 NOTE — Progress Notes (Deleted)
Office Visit Note  Patient: Lori Adams             Date of Birth: 1995/10/09           MRN: [id number]             PCP: Darreld Mclean, MD Referring: Glennon Mac, DO Visit Date: 10/23/2021 Occupation: _0 @  Subjective:  No chief complaint on file.   History of Present Illness: Lori Adams is a 26 y.o. female ***   Activities of Daily Living:  Patient reports morning stiffness for *** {minute/hour:19697}.   Patient {ACTIONS;DENIES/REPORTS:21021675::"Denies"} nocturnal pain.  Difficulty dressing/grooming: {ACTIONS;DENIES/REPORTS:21021675::"Denies"} Difficulty climbing stairs: {ACTIONS;DENIES/REPORTS:21021675::"Denies"} [name]: {ACTIONS;DENIES/REPORTS:21021675::"Denies"} Difficulty using hands for taps, buttons, cutlery, and/or writing: {ACTIONS;DENIES/REPORTS:21021675::"Denies"}  No Rheumatology ROS completed.   PMFS History:  Patient Active Problem List   Diagnosis Date Noted  . Neck pain 11/25/2019  . 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 ADHD 07/28/2018  . Hepatitis B immune 03/27/2017  . Irregular menses 10/09/2016  . PCOS (polycystic ovarian syndrome) 10/09/2016  . Weight gain 10/09/2016    Past Medical History:  Diagnosis Date  . PCOS (polycystic ovarian syndrome)     No family history on file. Past Surgical History:  Procedure Laterality Date  . NO PAST SURGERIES     Social History   Social History Narrative  . Not on file   Immunization History  Administered Date(s) Administered  . HPV Quadrivalent 10/01/2007, 12/10/2007, 04/03/2008  . Influenza,inj,Quad PF,6+ Mos 11/20/2016  . Td 11/11/2018  . Tdap 10/01/2007     Objective: Vital Signs: There were no vitals taken for this visit.   Physical Exam   Musculoskeletal Exam: ***  CDAI Exam: CDAI Score: -- Patient Global: --; Provider Global: -- Swollen: --; Tender: -- Joint Exam 10/23/2021   No joint exam has been documented for this visit    There is currently no information documented on the homunculus. Go to the Rheumatology activity and complete the homunculus joint exam.  Investigation: No additional findings.  Imaging: No results found.  Recent Labs: Lab Results  Component Value Date   WBC 11.7 (H) 05/14/2021   HGB 14.8 05/14/2021   PLT 336.0 05/14/2021   NA 137 05/14/2021   K 4.1 05/14/2021   CL 100 05/14/2021   CO2 27 05/14/2021   GLUCOSE 90 05/14/2021   BUN 14 05/14/2021   CREATININE 0.76 05/14/2021   BILITOT 0.4 05/14/2021   ALKPHOS 45 05/14/2021   AST 23 05/14/2021   ALT 35 05/14/2021   PROT 8.6 (H) 05/14/2021   ALBUMIN 5.3 (H) 05/14/2021   CALCIUM 10.4 05/14/2021   GFRAA >60 01/06/2017    Speciality Comments: No specialty comments available.  Procedures:  No procedures performed Allergies: Patient has no known allergies.   Assessment / Plan:     Visit Diagnoses: Positive ANA (antinuclear antibody) - 05/14/21: ANA 1:40 cytoplasmic, speckled, Uric acid 6.9, TSH 1.27, ESR 43, RF<14, Anti-CCP<16, CRP<1  Morning stiffness of joints  Polyarthralgia  Hypermobility syndrome  Neck pain  Irregular menses  PCOS (polycystic ovarian syndrome)  Hepatitis B immune  Orders: No orders of the defined types were placed in this encounter.  No orders of the defined types were placed in this encounter.   Face-to-face time spent with patient was *** minutes. Greater than 50% of time was spent in counseling and coordination of care.  Follow-Up Instructions: No follow-ups on file.   Ofilia Neas, PA-C  Note - This record has  been created using [employer].  Chart creation errors have been sought, but may not always  have been located. Such creation errors do not reflect on  the standard of medical care.

## 2021-10-23 ENCOUNTER — Ambulatory Visit: Payer: Self-pay | Admitting: Rheumatology

## 2021-10-23 DIAGNOSIS — M357 Hypermobility syndrome: Secondary | ICD-10-CM

## 2021-10-23 DIAGNOSIS — Z789 Other specified health status: Secondary | ICD-10-CM

## 2021-10-23 DIAGNOSIS — M542 Cervicalgia: Secondary | ICD-10-CM

## 2021-10-23 DIAGNOSIS — E282 Polycystic ovarian syndrome: Secondary | ICD-10-CM

## 2021-10-23 DIAGNOSIS — M2560 Stiffness of unspecified joint, not elsewhere classified: Secondary | ICD-10-CM

## 2021-10-23 DIAGNOSIS — R768 Other specified abnormal immunological findings in serum: Secondary | ICD-10-CM

## 2021-10-23 DIAGNOSIS — N926 Irregular menstruation, unspecified: Secondary | ICD-10-CM

## 2021-10-23 DIAGNOSIS — M2550 Pain in unspecified joint: Secondary | ICD-10-CM

## 2021-11-19 ENCOUNTER — Ambulatory Visit: Payer: 59 | Admitting: Rheumatology

## 2022-05-07 ENCOUNTER — Ambulatory Visit: Payer: Self-pay | Admitting: Rheumatology

## 2022-06-02 ENCOUNTER — Encounter: Payer: Self-pay | Admitting: *Deleted

## 2023-02-16 ENCOUNTER — Telehealth: Payer: Self-pay | Admitting: Family Medicine

## 2023-02-16 NOTE — Telephone Encounter (Signed)
Lvm advising patient she is due for her yearly physical. Requested call back to schedule.
